# Patient Record
Sex: Female | Born: 1980 | Race: White | Hispanic: Yes | Marital: Married | Smoking: Never smoker
Health system: Southern US, Community
[De-identification: ages and names within clinical notes are randomized; demographics above are authoritative.]

## PROBLEM LIST (undated history)

## (undated) DIAGNOSIS — D649 Anemia, unspecified: Secondary | ICD-10-CM

## (undated) HISTORY — DX: Anemia, unspecified: D64.9

## (undated) HISTORY — PX: TUBAL LIGATION: SHX77

---

## 2002-12-30 ENCOUNTER — Encounter: Admission: RE | Admit: 2002-12-30 | Discharge: 2002-12-30 | Payer: Self-pay | Admitting: *Deleted

## 2002-12-31 ENCOUNTER — Ambulatory Visit (HOSPITAL_COMMUNITY): Admission: RE | Admit: 2002-12-31 | Discharge: 2002-12-31 | Payer: Self-pay | Admitting: *Deleted

## 2002-12-31 ENCOUNTER — Encounter: Admission: RE | Admit: 2002-12-31 | Discharge: 2002-12-31 | Payer: Self-pay | Admitting: *Deleted

## 2003-01-06 ENCOUNTER — Encounter: Admission: RE | Admit: 2003-01-06 | Discharge: 2003-01-06 | Payer: Self-pay | Admitting: *Deleted

## 2003-01-20 ENCOUNTER — Encounter: Admission: RE | Admit: 2003-01-20 | Discharge: 2003-01-20 | Payer: Self-pay | Admitting: *Deleted

## 2003-01-27 ENCOUNTER — Encounter: Admission: RE | Admit: 2003-01-27 | Discharge: 2003-01-27 | Payer: Self-pay | Admitting: *Deleted

## 2003-02-10 ENCOUNTER — Encounter: Admission: RE | Admit: 2003-02-10 | Discharge: 2003-02-10 | Payer: Self-pay | Admitting: *Deleted

## 2003-02-17 ENCOUNTER — Encounter: Admission: RE | Admit: 2003-02-17 | Discharge: 2003-02-17 | Payer: Self-pay | Admitting: *Deleted

## 2003-02-19 ENCOUNTER — Inpatient Hospital Stay (HOSPITAL_COMMUNITY): Admission: AD | Admit: 2003-02-19 | Discharge: 2003-02-20 | Payer: Self-pay | Admitting: *Deleted

## 2007-07-14 ENCOUNTER — Emergency Department (HOSPITAL_COMMUNITY): Admission: EM | Admit: 2007-07-14 | Discharge: 2007-07-14 | Payer: Self-pay | Admitting: Emergency Medicine

## 2007-07-16 ENCOUNTER — Inpatient Hospital Stay (HOSPITAL_COMMUNITY): Admission: AD | Admit: 2007-07-16 | Discharge: 2007-07-16 | Payer: Self-pay | Admitting: Gynecology

## 2007-07-17 ENCOUNTER — Ambulatory Visit: Payer: Self-pay | Admitting: Obstetrics and Gynecology

## 2007-07-17 ENCOUNTER — Ambulatory Visit (HOSPITAL_COMMUNITY): Admission: AD | Admit: 2007-07-17 | Discharge: 2007-07-17 | Payer: Self-pay | Admitting: Gynecology

## 2007-07-17 ENCOUNTER — Encounter: Payer: Self-pay | Admitting: Obstetrics and Gynecology

## 2007-07-31 ENCOUNTER — Ambulatory Visit: Payer: Self-pay | Admitting: Obstetrics and Gynecology

## 2007-08-24 ENCOUNTER — Emergency Department (HOSPITAL_COMMUNITY): Admission: EM | Admit: 2007-08-24 | Discharge: 2007-08-24 | Payer: Self-pay | Admitting: Emergency Medicine

## 2008-12-23 IMAGING — US US OB COMP LESS 14 WK
1 series · 14 of 26 positions shown · non-contrast
Comparison: None

CLINICAL DATA: 16 weeks pregnant by last menstrual period with bleeding.

OBSTETRICAL ULTRASOUND <14 WK TRANSABDOMINAL AND TRANSVAGINAL OB:
TECHNIQUE: Both transabdominal and transvaginal ultrasound examinations were
performed for complete evaluation of the gestation as well as the maternal
uterus, adnexal regions, and pelvic cul-de-sac.

[Series 1: us ob comp less 14 wks · 14 of 26 slices shown]
[im 1/26]
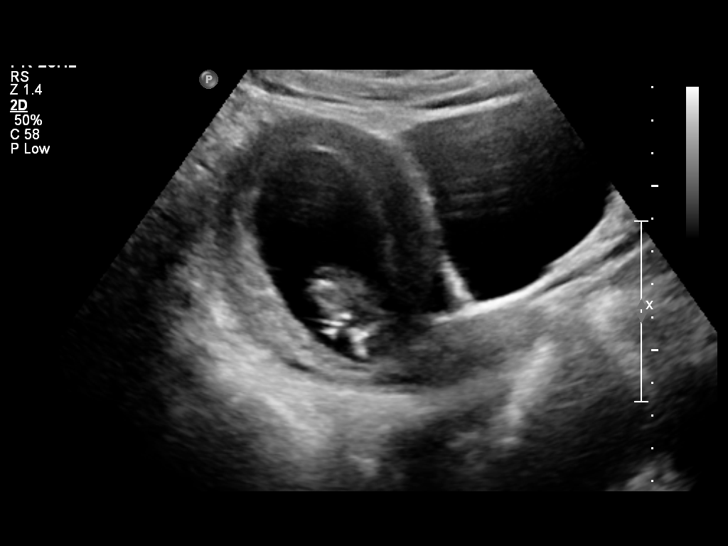
[im 3/26]
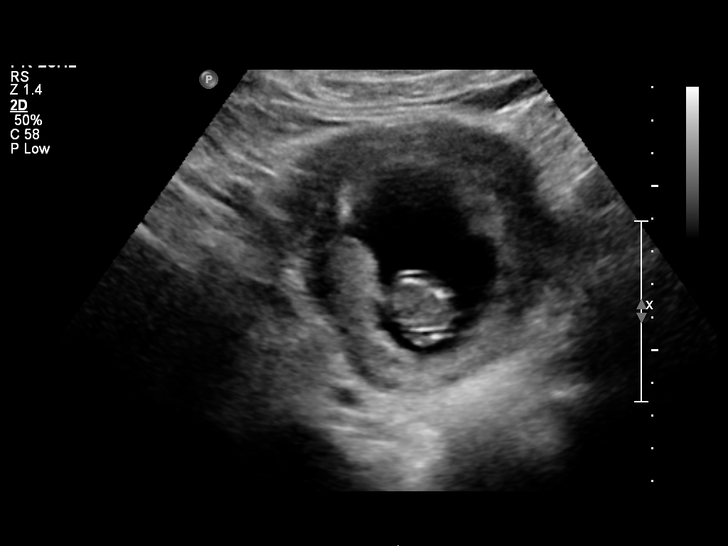
[im 5/26]
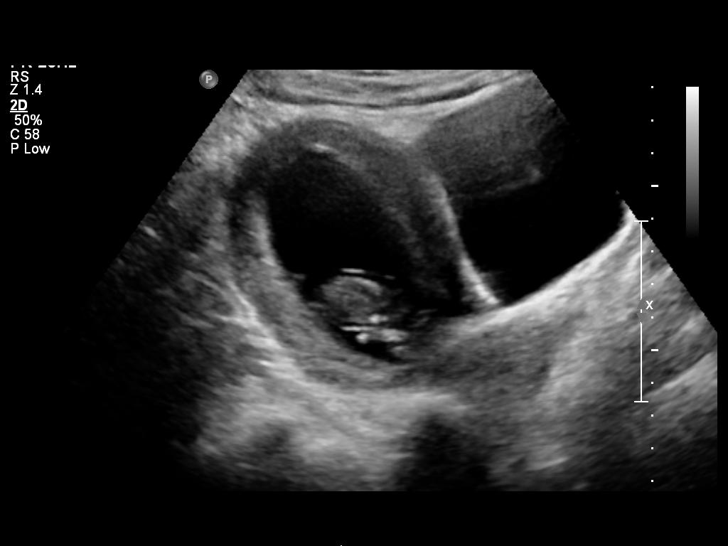
[im 7/26]
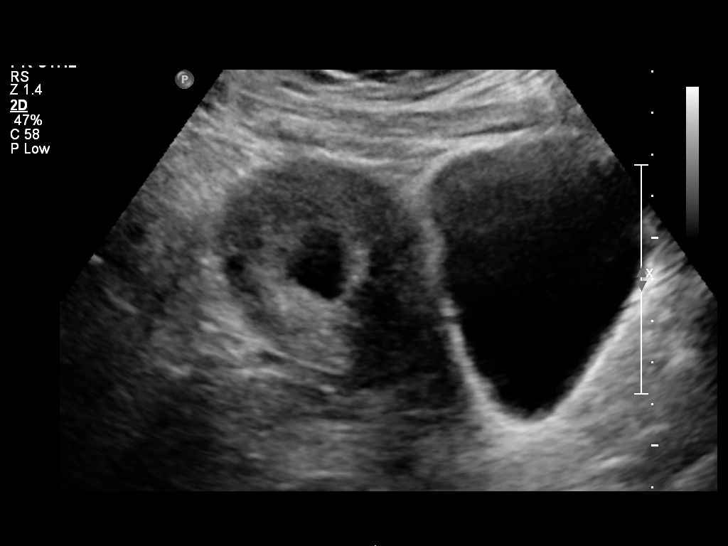
[im 9/26]
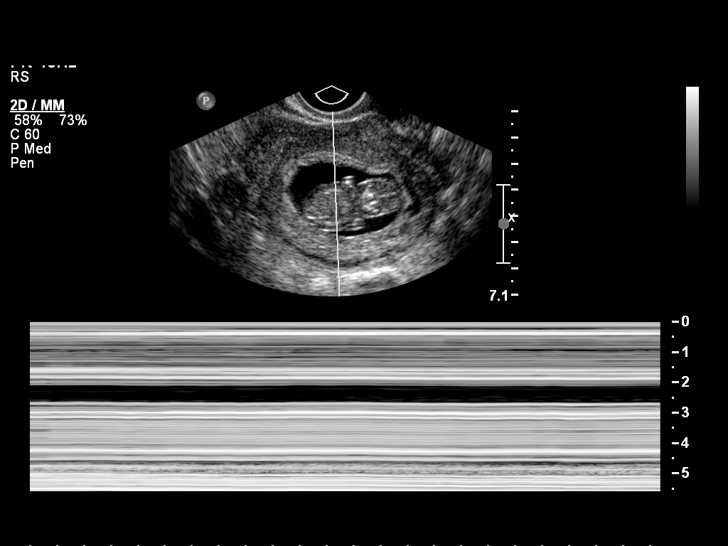
[im 11/26]
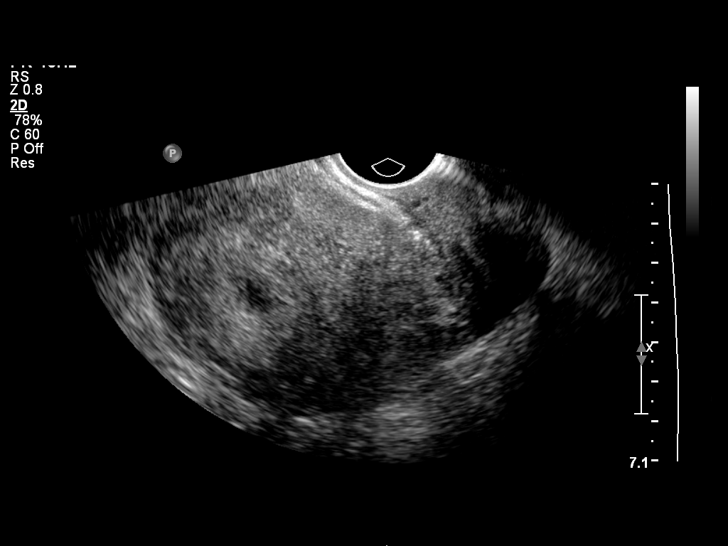
[im 13/26]
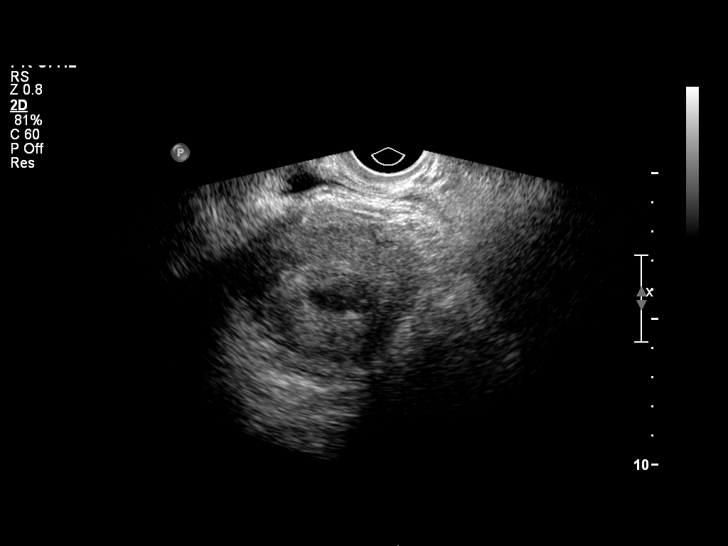
[im 14/26]
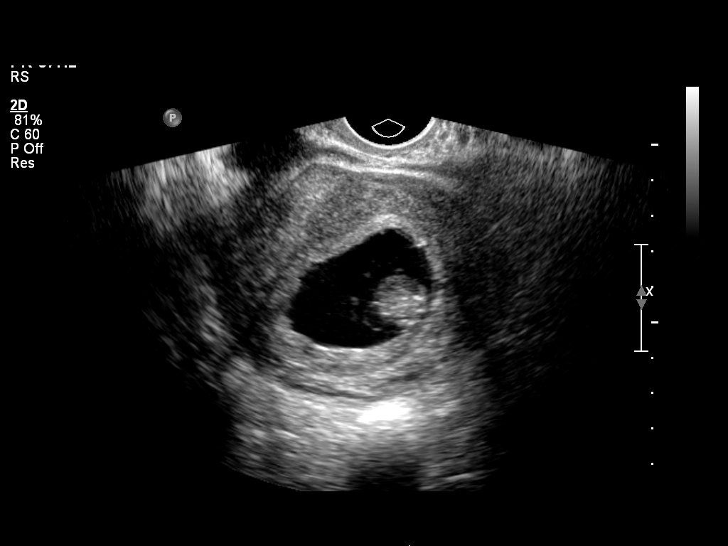
[im 16/26]
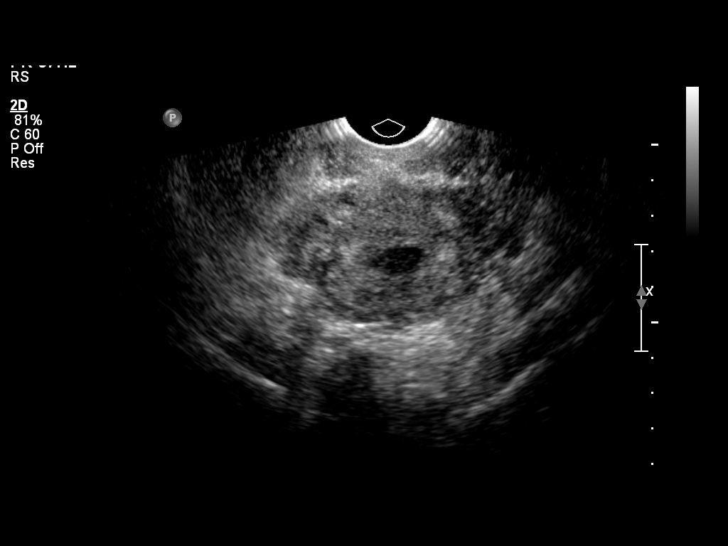
[im 18/26]
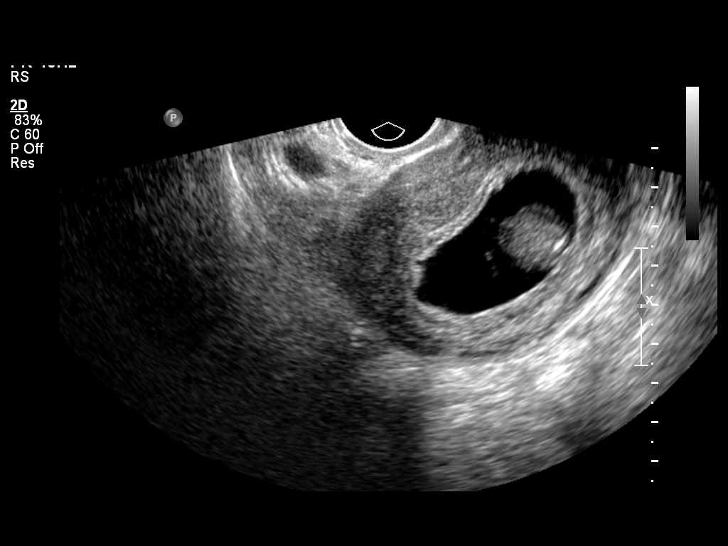
[im 20/26]
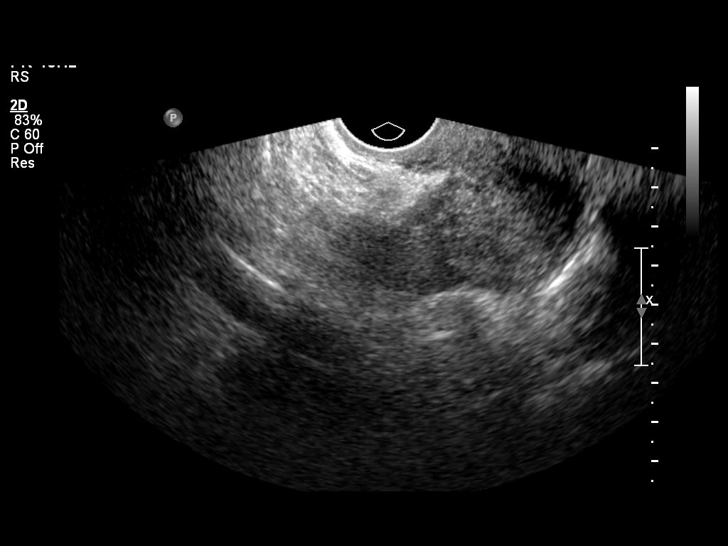
[im 22/26]
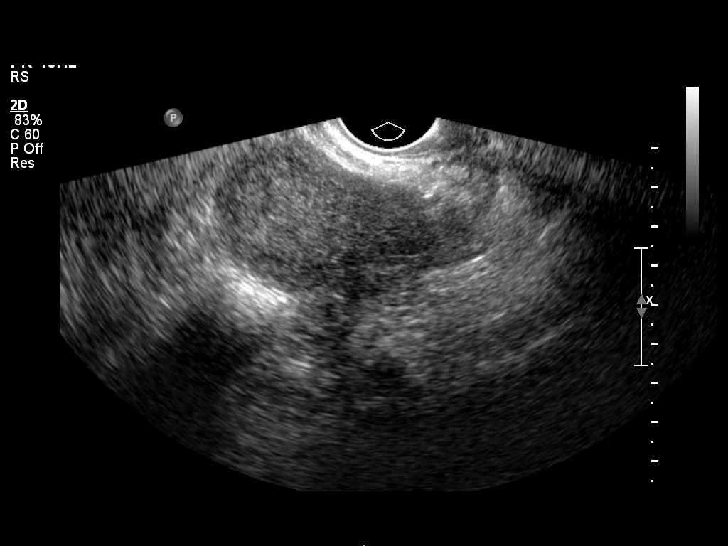
[im 24/26]
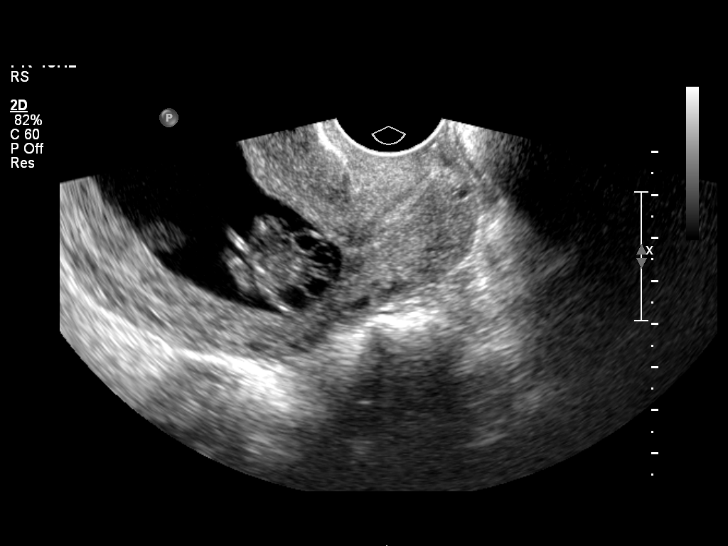
[im 26/26]
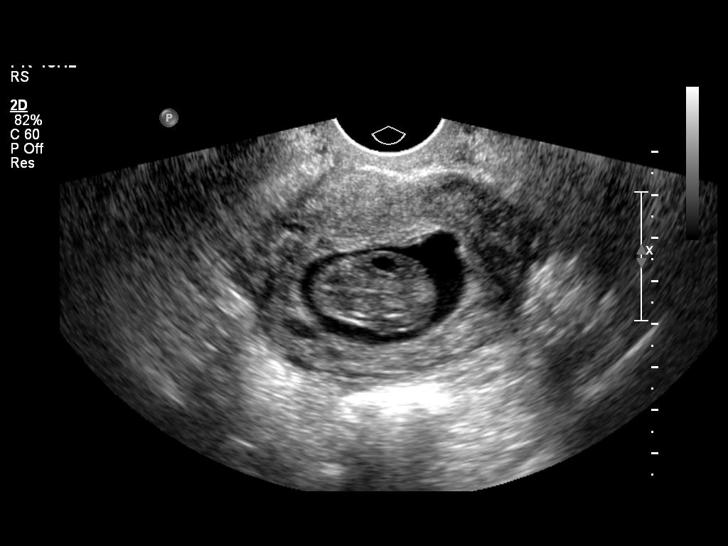

[14 of 26 positions shown; findings below may reference images not displayed]

FINDINGS: Single intrauterine gestational sac with a crown-rump length of
cm corresponds to 10 weeks 6 days. Despite extensive interrogation, no evidence
of fetal movement or cardiac activity. No subchorionic hemorrhage.

Lack of visualization of either ovary. No adnexal mass or significant free
fluid.
IMPRESSION: 1. Findings most consistent with fetal demise. 3.9 cm fetus without motion or
cardiac activity. Findings were discussed by the ultrasound technologist,
Tiger, with [facility] prior to this dictation on 07/15/2006.
2. Lack of visualization of the ovaries.

## 2010-10-18 NOTE — Op Note (Signed)
Christy Graham, Christy Graham        ACCOUNT NO.:  [id number]   MEDICAL RECORD NO.:  [id number]          PATIENT TYPE:  AMB   LOCATION:  [facility]                          FACILITY:  [facility]   PHYSICIAN:  Tilda Burrow, M.D. DATE OF BIRTH:  10/23/1980   DATE OF PROCEDURE:  07/17/2007  DATE OF DISCHARGE:  07/17/2007                               OPERATIVE REPORT   PREOPERATIVE DIAGNOSIS:  Missed abortion, 16 weeks, left gluteal skin  tag.   POSTOPERATIVE DIAGNOSIS:  Missed abortion, 16 weeks, left gluteal skin  tag.   PROCEDURE:  Incidental removal of left gluteal skin tag along with  suction dilation and curettage.   SURGEON:  Tilda Burrow, M.D.   ASSISTANT:  None.   ANESTHESIA:  Paracervical with IV sedation.   COMPLICATIONS:  None.   FINDINGS:  Blood type A+, uterus sounding to 11 cm pre procedure and  reducing in size nicely with procedure.   DETAILS OF PROCEDURE:  The patient was taken operating room, prepped and  draped for vaginal procedure with low lithotomy leg support.  The  perineum and vagina were prepped, paracervical block applied including  anesthesia anterior cervical lip where a single-tooth tenaculum was used  to grasp the cervix.  The uterus was sounded in the anteflexed position  to 11 cm dilated to 33 [place] allowing introduction of a curved 10 mm  suction curette which obtained fluid and tissue and old blood consistent  with missed abortion.  The uterus shrank down nicely during the  procedure and post suction D&C curettage with smooth sharp curette  confirmed a uniform smooth internal uterine surface.  The procedure was  considered successfully completed.   Skin tag removal.  After prepping and prior to the dilation and  curettage, I used a hemostat to crossclamp the pedicle of the skin tag  having already placed local anesthesia beneath the its base.  The  crossclamping site was then transected and was quite hemostatic.  At the  completion of the  remainder of the surgical procedure, band-aid was  placed over this, no sutures were required, bleeding was minimal.      Tilda Burrow, M.D.  Electronically Signed    JVF/MEDQ  D:  07/17/2007  T:  07/19/2007  Job:  [id number]

## 2011-02-24 LAB — CBC
HCT: 37.2
Hemoglobin: 12.4
Hemoglobin: 12.6
MCHC: 33.8
MCV: 82
RBC: 4.5
RBC: 4.53
WBC: 8.7
WBC: 8.3

## 2011-02-24 LAB — ABO/RH: ABO/RH(D): A POS

## 2011-02-24 LAB — DIFFERENTIAL
Eosinophils Absolute: 0.2
Eosinophils Relative: 2
Lymphs Abs: 2.1
Monocytes Absolute: 0.6
Monocytes Relative: 7
Neutrophils Relative %: 66

## 2011-02-24 LAB — HCG, QUANTITATIVE, PREGNANCY: hCG, Beta Chain, Quant, S: 262 — ABNORMAL HIGH

## 2011-05-12 ENCOUNTER — Encounter (HOSPITAL_COMMUNITY): Payer: Self-pay | Admitting: Anesthesiology

## 2011-05-12 ENCOUNTER — Encounter (HOSPITAL_COMMUNITY): Payer: Self-pay | Admitting: *Deleted

## 2011-05-12 ENCOUNTER — Inpatient Hospital Stay (HOSPITAL_COMMUNITY)
Admission: AD | Admit: 2011-05-12 | Discharge: 2011-05-14 | DRG: 766 | Disposition: A | Discharge status: Home / Self Care | Payer: Medicaid Other | Source: Ambulatory Visit | Attending: Obstetrics | Admitting: Obstetrics

## 2011-05-12 ENCOUNTER — Inpatient Hospital Stay (HOSPITAL_COMMUNITY): Payer: Medicaid Other

## 2011-05-12 ENCOUNTER — Other Ambulatory Visit: Payer: Self-pay | Admitting: Obstetrics

## 2011-05-12 ENCOUNTER — Inpatient Hospital Stay (HOSPITAL_COMMUNITY): Payer: Medicaid Other | Admitting: Anesthesiology

## 2011-05-12 ENCOUNTER — Encounter (HOSPITAL_COMMUNITY): Payer: Self-pay | Admitting: Pediatric Intensive Care

## 2011-05-12 ENCOUNTER — Encounter (HOSPITAL_COMMUNITY)
Admission: AD | Disposition: A | Discharge status: Home / Self Care | Payer: Self-pay | Source: Ambulatory Visit | Attending: Obstetrics

## 2011-05-12 DIAGNOSIS — IMO0002 Reserved for concepts with insufficient information to code with codable children: Secondary | ICD-10-CM

## 2011-05-12 DIAGNOSIS — O321XX0 Maternal care for breech presentation, not applicable or unspecified: Principal | ICD-10-CM | POA: Diagnosis present

## 2011-05-12 DIAGNOSIS — O4700 False labor before 37 completed weeks of gestation, unspecified trimester: Secondary | ICD-10-CM

## 2011-05-12 DIAGNOSIS — Z302 Encounter for sterilization: Secondary | ICD-10-CM

## 2011-05-12 LAB — RPR: RPR Ser Ql: NONREACTIVE

## 2011-05-12 LAB — CBC
Hemoglobin: 12 g/dL (ref 12.0–15.0)
MCH: 26.8 pg (ref 26.0–34.0)
MCV: 81.9 fL (ref 78.0–100.0)
RBC: 4.48 MIL/uL (ref 3.87–5.11)

## 2011-05-12 SURGERY — Surgical Case
Anesthesia: Spinal | Site: Abdomen | Wound class: Clean Contaminated

## 2011-05-12 MED ORDER — KETOROLAC TROMETHAMINE 60 MG/2ML IM SOLN
60.0000 mg | Freq: Once | INTRAMUSCULAR | Status: AC | PRN
  Administered 2011-05-12: 60 mg via INTRAMUSCULAR

## 2011-05-12 MED ORDER — OXYTOCIN BOLUS FROM INFUSION
500.0000 mL | Freq: Once | INTRAVENOUS | Status: DC
  Filled 2011-05-12: qty 500

## 2011-05-12 MED ORDER — WITCH HAZEL-GLYCERIN EX PADS: 1.0000 "application " | MEDICATED_PAD | CUTANEOUS | Status: DC | PRN

## 2011-05-12 MED ORDER — SENNOSIDES-DOCUSATE SODIUM 8.6-50 MG PO TABS
2.0000 | ORAL_TABLET | Freq: Every day | ORAL | Status: DC
  Administered 2011-05-12 – 2011-05-13 (×2): 2 via ORAL

## 2011-05-12 MED ORDER — DIPHENHYDRAMINE HCL 25 MG PO CAPS: 25.0000 mg | ORAL_CAPSULE | Freq: Four times a day (QID) | ORAL | Status: DC | PRN

## 2011-05-12 MED ORDER — OXYTOCIN 20 UNITS IN LACTATED RINGERS INFUSION - SIMPLE
INTRAVENOUS | Status: DC | PRN
  Administered 2011-05-12 (×2): 20 [IU] via INTRAVENOUS

## 2011-05-12 MED ORDER — OXYCODONE-ACETAMINOPHEN 5-325 MG PO TABS
1.0000 | ORAL_TABLET | ORAL | Status: DC | PRN
  Administered 2011-05-12: 1 via ORAL
  Administered 2011-05-13 (×2): 2 via ORAL
  Administered 2011-05-13 – 2011-05-14 (×3): 1 via ORAL
  Administered 2011-05-14: 2 via ORAL
  Filled 2011-05-12: qty 1
  Filled 2011-05-12: qty 2
  Filled 2011-05-12: qty 1
  Filled 2011-05-12: qty 2
  Filled 2011-05-12 (×2): qty 1
  Filled 2011-05-12: qty 2

## 2011-05-12 MED ORDER — OXYTOCIN 20 UNITS IN LACTATED RINGERS INFUSION - SIMPLE: 125.0000 mL/h | INTRAVENOUS | Status: AC

## 2011-05-12 MED ORDER — LACTATED RINGERS IV SOLN
INTRAVENOUS | Status: DC | PRN
  Administered 2011-05-12: 07:00:00 via INTRAVENOUS

## 2011-05-12 MED ORDER — METOCLOPRAMIDE HCL 5 MG/ML IJ SOLN: 10.0000 mg | Freq: Three times a day (TID) | INTRAMUSCULAR | Status: DC | PRN

## 2011-05-12 MED ORDER — OXYTOCIN 20 UNITS IN LACTATED RINGERS INFUSION - SIMPLE: 125.0000 mL/h | Freq: Once | INTRAVENOUS | Status: DC

## 2011-05-12 MED ORDER — MEPERIDINE HCL 25 MG/ML IJ SOLN: 6.2500 mg | INTRAMUSCULAR | Status: DC | PRN

## 2011-05-12 MED ORDER — SODIUM CHLORIDE 0.9 % IV SOLN
1.0000 ug/kg/h | INTRAVENOUS | Status: DC | PRN
  Filled 2011-05-12: qty 2.5

## 2011-05-12 MED ORDER — FENTANYL CITRATE 0.05 MG/ML IJ SOLN: 25.0000 ug | INTRAMUSCULAR | Status: DC | PRN

## 2011-05-12 MED ORDER — OXYTOCIN 20 UNITS IN LACTATED RINGERS INFUSION - SIMPLE: 1.0000 m[IU]/min | INTRAVENOUS | Status: DC

## 2011-05-12 MED ORDER — TETANUS-DIPHTH-ACELL PERTUSSIS 5-2.5-18.5 LF-MCG/0.5 IM SUSP: 0.5000 mL | Freq: Once | INTRAMUSCULAR | Status: DC

## 2011-05-12 MED ORDER — CITRIC ACID-SODIUM CITRATE 334-500 MG/5ML PO SOLN
30.0000 mL | ORAL | Status: DC | PRN
  Administered 2011-05-12: 30 mL via ORAL
  Filled 2011-05-12: qty 15

## 2011-05-12 MED ORDER — PRENATAL PLUS 27-1 MG PO TABS
1.0000 | ORAL_TABLET | Freq: Every day | ORAL | Status: DC
  Administered 2011-05-13 – 2011-05-14 (×2): 1 via ORAL
  Filled 2011-05-12 (×2): qty 1

## 2011-05-12 MED ORDER — BUPIVACAINE IN DEXTROSE 0.75-8.25 % IT SOLN
INTRATHECAL | Status: DC | PRN
  Administered 2011-05-12: 11.75 mg via INTRATHECAL

## 2011-05-12 MED ORDER — ONDANSETRON HCL 4 MG/2ML IJ SOLN: 4.0000 mg | INTRAMUSCULAR | Status: DC | PRN

## 2011-05-12 MED ORDER — DIPHENHYDRAMINE HCL 50 MG/ML IJ SOLN: 12.5000 mg | INTRAMUSCULAR | Status: DC | PRN

## 2011-05-12 MED ORDER — DIBUCAINE 1 % RE OINT: 1.0000 "application " | TOPICAL_OINTMENT | RECTAL | Status: DC | PRN

## 2011-05-12 MED ORDER — SIMETHICONE 80 MG PO CHEW
80.0000 mg | CHEWABLE_TABLET | Freq: Three times a day (TID) | ORAL | Status: DC
  Administered 2011-05-12 – 2011-05-13 (×5): 80 mg via ORAL

## 2011-05-12 MED ORDER — BUTORPHANOL TARTRATE 2 MG/ML IJ SOLN: 1.0000 mg | INTRAMUSCULAR | Status: DC | PRN

## 2011-05-12 MED ORDER — ONDANSETRON HCL 4 MG/2ML IJ SOLN
INTRAMUSCULAR | Status: DC | PRN
  Administered 2011-05-12: 4 mg via INTRAVENOUS

## 2011-05-12 MED ORDER — LACTATED RINGERS IV SOLN
INTRAVENOUS | Status: DC
  Administered 2011-05-12: 13:00:00 via INTRAVENOUS

## 2011-05-12 MED ORDER — SIMETHICONE 80 MG PO CHEW: 80.0000 mg | CHEWABLE_TABLET | ORAL | Status: DC | PRN

## 2011-05-12 MED ORDER — ONDANSETRON HCL 4 MG/2ML IJ SOLN: 4.0000 mg | Freq: Three times a day (TID) | INTRAMUSCULAR | Status: DC | PRN

## 2011-05-12 MED ORDER — NALBUPHINE SYRINGE 5 MG/0.5 ML
5.0000 mg | INJECTION | INTRAMUSCULAR | Status: DC | PRN
  Filled 2011-05-12: qty 1

## 2011-05-12 MED ORDER — SODIUM CHLORIDE 0.9 % IJ SOLN: 3.0000 mL | INTRAMUSCULAR | Status: DC | PRN

## 2011-05-12 MED ORDER — MORPHINE SULFATE (PF) 0.5 MG/ML IJ SOLN
INTRAMUSCULAR | Status: DC | PRN
  Administered 2011-05-12: .1 mg via INTRATHECAL

## 2011-05-12 MED ORDER — LIDOCAINE HCL (PF) 1 % IJ SOLN: 30.0000 mL | INTRAMUSCULAR | Status: DC | PRN

## 2011-05-12 MED ORDER — KETOROLAC TROMETHAMINE 30 MG/ML IJ SOLN: 30.0000 mg | Freq: Four times a day (QID) | INTRAMUSCULAR | Status: AC | PRN

## 2011-05-12 MED ORDER — HYDROXYZINE HCL 50 MG/ML IM SOLN: 50.0000 mg | Freq: Four times a day (QID) | INTRAMUSCULAR | Status: DC | PRN

## 2011-05-12 MED ORDER — DIPHENHYDRAMINE HCL 50 MG/ML IJ SOLN: 25.0000 mg | INTRAMUSCULAR | Status: DC | PRN

## 2011-05-12 MED ORDER — SCOPOLAMINE 1 MG/3DAYS TD PT72
MEDICATED_PATCH | TRANSDERMAL | Status: AC
  Filled 2011-05-12: qty 1

## 2011-05-12 MED ORDER — ONDANSETRON HCL 4 MG/2ML IJ SOLN
INTRAMUSCULAR | Status: AC
  Filled 2011-05-12: qty 2

## 2011-05-12 MED ORDER — TERBUTALINE SULFATE 1 MG/ML IJ SOLN: 0.2500 mg | Freq: Once | INTRAMUSCULAR | Status: DC | PRN

## 2011-05-12 MED ORDER — LANOLIN HYDROUS EX OINT: 1.0000 "application " | TOPICAL_OINTMENT | CUTANEOUS | Status: DC | PRN

## 2011-05-12 MED ORDER — LACTATED RINGERS IV SOLN: 500.0000 mL | INTRAVENOUS | Status: DC | PRN

## 2011-05-12 MED ORDER — SCOPOLAMINE 1 MG/3DAYS TD PT72
1.0000 | MEDICATED_PATCH | Freq: Once | TRANSDERMAL | Status: DC
  Administered 2011-05-12: 1.5 mg via TRANSDERMAL

## 2011-05-12 MED ORDER — IBUPROFEN 600 MG PO TABS: 600.0000 mg | ORAL_TABLET | Freq: Four times a day (QID) | ORAL | Status: DC | PRN

## 2011-05-12 MED ORDER — KETOROLAC TROMETHAMINE 60 MG/2ML IM SOLN
INTRAMUSCULAR | Status: AC
  Administered 2011-05-12: 60 mg via INTRAMUSCULAR
  Filled 2011-05-12: qty 2

## 2011-05-12 MED ORDER — NALOXONE HCL 0.4 MG/ML IJ SOLN: 0.4000 mg | INTRAMUSCULAR | Status: DC | PRN

## 2011-05-12 MED ORDER — KETOROLAC TROMETHAMINE 30 MG/ML IJ SOLN
30.0000 mg | Freq: Four times a day (QID) | INTRAMUSCULAR | Status: AC | PRN
  Administered 2011-05-12: 30 mg via INTRAMUSCULAR
  Filled 2011-05-12: qty 1

## 2011-05-12 MED ORDER — ZOLPIDEM TARTRATE 5 MG PO TABS: 5.0000 mg | ORAL_TABLET | Freq: Every evening | ORAL | Status: DC | PRN

## 2011-05-12 MED ORDER — CEFAZOLIN SODIUM 1-5 GM-% IV SOLN
INTRAVENOUS | Status: DC | PRN
  Administered 2011-05-12: 1 g via INTRAVENOUS

## 2011-05-12 MED ORDER — DIPHENHYDRAMINE HCL 25 MG PO CAPS: 25.0000 mg | ORAL_CAPSULE | ORAL | Status: DC | PRN

## 2011-05-12 MED ORDER — FENTANYL CITRATE 0.05 MG/ML IJ SOLN
INTRAMUSCULAR | Status: AC
  Filled 2011-05-12: qty 2

## 2011-05-12 MED ORDER — MENTHOL 3 MG MT LOZG: 1.0000 | LOZENGE | OROMUCOSAL | Status: DC | PRN

## 2011-05-12 MED ORDER — FENTANYL CITRATE 0.05 MG/ML IJ SOLN
INTRAMUSCULAR | Status: DC | PRN
  Administered 2011-05-12: 15 ug via INTRATHECAL

## 2011-05-12 MED ORDER — HYDROXYZINE HCL 50 MG PO TABS: 50.0000 mg | ORAL_TABLET | Freq: Four times a day (QID) | ORAL | Status: DC | PRN

## 2011-05-12 MED ORDER — MORPHINE SULFATE 0.5 MG/ML IJ SOLN
INTRAMUSCULAR | Status: AC
  Filled 2011-05-12: qty 10

## 2011-05-12 MED ORDER — ACETAMINOPHEN 325 MG PO TABS: 650.0000 mg | ORAL_TABLET | ORAL | Status: DC | PRN

## 2011-05-12 MED ORDER — OXYTOCIN 10 UNIT/ML IJ SOLN
INTRAMUSCULAR | Status: AC
  Filled 2011-05-12: qty 4

## 2011-05-12 MED ORDER — IBUPROFEN 600 MG PO TABS
600.0000 mg | ORAL_TABLET | Freq: Four times a day (QID) | ORAL | Status: DC
  Administered 2011-05-12 – 2011-05-14 (×8): 600 mg via ORAL

## 2011-05-12 MED ORDER — ONDANSETRON HCL 4 MG/2ML IJ SOLN: 4.0000 mg | Freq: Four times a day (QID) | INTRAMUSCULAR | Status: DC | PRN

## 2011-05-12 MED ORDER — OXYCODONE-ACETAMINOPHEN 5-325 MG PO TABS: 2.0000 | ORAL_TABLET | ORAL | Status: DC | PRN

## 2011-05-12 MED ORDER — IBUPROFEN 600 MG PO TABS
600.0000 mg | ORAL_TABLET | Freq: Four times a day (QID) | ORAL | Status: DC | PRN
  Filled 2011-05-12 (×8): qty 1

## 2011-05-12 MED ORDER — ZOLPIDEM TARTRATE 10 MG PO TABS: 10.0000 mg | ORAL_TABLET | Freq: Every evening | ORAL | Status: DC | PRN

## 2011-05-12 MED ORDER — ONDANSETRON HCL 4 MG PO TABS: 4.0000 mg | ORAL_TABLET | ORAL | Status: DC | PRN

## 2011-05-12 MED ORDER — LACTATED RINGERS IV SOLN
INTRAVENOUS | Status: DC
  Administered 2011-05-12: 05:00:00 via INTRAVENOUS

## 2011-05-12 SURGICAL SUPPLY — 28 items
ADH SKN CLS APL DERMABOND .7 (GAUZE/BANDAGES/DRESSINGS) ×1
CHLORAPREP W/TINT 26ML (MISCELLANEOUS) ×2 IMPLANT
CLOTH BEACON ORANGE TIMEOUT ST (SAFETY) ×2 IMPLANT
DERMABOND ADVANCED (GAUZE/BANDAGES/DRESSINGS) ×1
DERMABOND ADVANCED .7 DNX12 (GAUZE/BANDAGES/DRESSINGS) ×1 IMPLANT
ELECT REM PT RETURN 9FT ADLT (ELECTROSURGICAL) ×2
ELECTRODE REM PT RTRN 9FT ADLT (ELECTROSURGICAL) ×1 IMPLANT
EXTRACTOR VACUUM M CUP 4 TUBE (SUCTIONS) IMPLANT
GLOVE BIO SURGEON STRL SZ8.5 (GLOVE) ×4 IMPLANT
GOWN PREVENTION PLUS LG XLONG (DISPOSABLE) ×4 IMPLANT
GOWN PREVENTION PLUS XXLARGE (GOWN DISPOSABLE) ×2 IMPLANT
KIT ABG SYR 3ML LUER SLIP (SYRINGE) IMPLANT
NDL HYPO 25X5/8 SAFETYGLIDE (NEEDLE) ×1 IMPLANT
NEEDLE HYPO 25X5/8 SAFETYGLIDE (NEEDLE) ×2 IMPLANT
NS IRRIG 1000ML POUR BTL (IV SOLUTION) ×2 IMPLANT
PACK C SECTION WH (CUSTOM PROCEDURE TRAY) ×2 IMPLANT
SLEEVE SCD COMPRESS KNEE MED (MISCELLANEOUS) IMPLANT
SUT CHROMIC 0 CT 802H (SUTURE) ×2 IMPLANT
SUT CHROMIC 1 CTX 36 (SUTURE) ×4 IMPLANT
SUT GUT PLAIN 0 CT-3 TAN 27 (SUTURE) IMPLANT
SUT MON AB 4-0 PS1 27 (SUTURE) ×2 IMPLANT
SUT VIC AB 0 CT1 18XCR BRD8 (SUTURE) IMPLANT
SUT VIC AB 0 CT1 8-18 (SUTURE)
SUT VIC AB 0 CTX 36 (SUTURE) ×4
SUT VIC AB 0 CTX36XBRD ANBCTRL (SUTURE) ×2 IMPLANT
TOWEL OR 17X24 6PK STRL BLUE (TOWEL DISPOSABLE) ×4 IMPLANT
TRAY FOLEY CATH 14FR (SET/KITS/TRAYS/PACK) ×2 IMPLANT
WATER STERILE IRR 1000ML POUR (IV SOLUTION) ×2 IMPLANT

## 2011-05-12 NOTE — Op Note (Signed)
[facility] 39+ weeks active labor and breech presentation Postop diagnosis primary low transverse cesarean section and tubal ligation Anesthesia spinal Surgeon Dr. Francoise Ceo Procedure after the spinal administered patient in the supine position abdomen prepped and draped data entered with a Foley catheter a transverse suprapubic incision made carried down to the rectus fascia fascia cleaned and incised the length of the incision recti muscles retracted laterally peritoneum incised longitudinally transverse incision made over the visceroperitoneum above the bladder the bladder mobilized inferiorly transverse low uterine incision made patient delivered of a frank breech female Apgar 89 weighing 5 lbs. 9 oz. the placenta was posterior Moban and and sent to labor and delivery uterine cavity clean with dry laps the uterus closed in one layer with continuous within normal on chromic bladder flap reattached to a chromic hemostasis satisfactory the right tube  Grasped with a babcock clamp and a suture of 0 plain used to tie the tube and 1 inch tube removed procedure done in a similar fashion on the other side  lap and sponge counts correct hemostasis satisfactory abdomen chosen as peritoneum continuous with 2-0 chromic fascia contiguous with 0 Dexon and the skin shows a subcuticular stitch of 401 blood loss 600 cc patient tolerated the procedure well taken to recovery in good condition end of dictation dictated by Dr. Gaynell Face

## 2011-05-12 NOTE — Progress Notes (Signed)
Pt delivered viable female via primary cesarean section for breech presentation by Dr Gaynell Face.

## 2011-05-12 NOTE — Anesthesia Preprocedure Evaluation (Signed)
Anesthesia Evaluation  Patient identified by MRN, date of birth, ID band Patient awake    Reviewed: Allergy & Precautions, H&P , NPO status , Patient's Chart, lab work & pertinent test results, reviewed documented beta blocker date and time   History of Anesthesia Complications Negative for: history of anesthetic complications  Airway Mallampati: II TM Distance: >3 FB Neck ROM: full    Dental  (+) Teeth Intact   Pulmonary neg pulmonary ROS,  clear to auscultation        Cardiovascular neg cardio ROS regular Normal    Neuro/Psych Negative Neurological ROS  Negative Psych ROS   GI/Hepatic negative GI ROS, Neg liver ROS,   Endo/Other  Negative Endocrine ROS  Renal/GU negative Renal ROS  Genitourinary negative   Musculoskeletal   Abdominal   Peds  Hematology negative hematology ROS (+)   Anesthesia Other Findings   Reproductive/Obstetrics (+) Pregnancy (breech in labor, h/o SVD x3 w/o epidural)                           Anesthesia Physical Anesthesia Plan  ASA: II and Emergent  Anesthesia Plan: Spinal   Post-op Pain Management:    Induction:   Airway Management Planned:   Additional Equipment:   Intra-op Plan:   Post-operative Plan:   Informed Consent: I have reviewed the patients History and Physical, chart, labs and discussed the procedure including the risks, benefits and alternatives for the proposed anesthesia with the patient or authorized representative who has indicated his/her understanding and acceptance.     Plan Discussed with: CRNA and Surgeon  Anesthesia Plan Comments:         Anesthesia Quick Evaluation

## 2011-05-12 NOTE — H&P (Signed)
Christy Graham is a 30 y.o. female presenting for SROM. Maternal Medical History:  Reason for admission: Reason for admission: rupture of membranes.  Reason for Admission:   nauseaMeconium-stained fluid.  Contractions: Onset was 3-5 hours ago.   Frequency: irregular.   Perceived severity is mild.    Fetal activity: Perceived fetal activity is normal.    Prenatal complications: no prenatal complications   OB History    Grav Para Term Preterm Abortions TAB SAB Ect Mult Living   5 3   1  1   3      History reviewed. No pertinent past medical history. History reviewed. No pertinent past surgical history. Family History: family history is not on file. Social History:  reports that she has never smoked. She does not have any smokeless tobacco history on file. She reports that she does not drink alcohol or use illicit drugs.  Review of Systems  Constitutional: Negative for fever.  Eyes: Negative for blurred vision.  Respiratory: Negative for shortness of breath.   Gastrointestinal: Negative for nausea and vomiting.  Skin: Negative for rash.  Neurological: Negative for headaches.    Dilation: 2.5 Effacement (%): 60 Station: -2 Exam by:: L. Munford RN Blood pressure 133/73, pulse 101, temperature 99.2 F (37.3 C), temperature source Oral, resp. rate 18, height 5\' 1"  (1.549 m), weight 79.379 kg (175 lb), SpO2 99.00%. Maternal Exam:  Uterine Assessment: Contraction strength is mild.  Contraction frequency is irregular.   Abdomen: Patient reports no abdominal tenderness. Fetal presentation: vertex  Introitus: not evaluated.     Fetal Exam Fetal Monitor Review: Variability: moderate (6-25 bpm).   Pattern: accelerations present and no decelerations.    Fetal State Assessment: Category I - tracings are normal.     Physical Exam  Constitutional: She appears well-developed.  HENT:  Head: Normocephalic.  Neck: Neck supple. No thyromegaly present.  Cardiovascular:  Normal rate and regular rhythm.   Respiratory: Breath sounds normal.  GI: Soft. Bowel sounds are normal.  Skin: No rash noted.    Prenatal labs: ABO, Rh:   Antibody:   Rubella:   RPR:    HBsAg:    HIV:    GBS:     Assessment/Plan: Multipara @ [redacted]w[redacted]d.  Prodromal labor, meconium-stained fluid.  Admit Augmentation of labor Monitor progress, anticipate an SVD  JACKSON-MOORE,Sylva Overley A 05/12/2011, 3:57 AM

## 2011-05-12 NOTE — Consult Note (Signed)
Neonatology Note:   Attendance at C-section:    I was asked to attend this primary C/S at term due to breech presentation. The mother is a G6P3A2  GBS unknown with onset of labor. ROM 8 hours prior to delivery, fluid with thin meconium. Mother did not receive antibiotics during labor and she remained afebrile. Infant delivered breech and was vigorous with good spontaneous cry and tone. Needed only minimal bulb suctioning. Ap 9/9. Lungs clear to ausc in DR. To CN to care of Pediatrician.   Deatra James, MD

## 2011-05-12 NOTE — Transfer of Care (Signed)
Immediate Anesthesia Transfer of Care Note  Patient: Christy Graham  Procedure(s) Performed:  CESAREAN SECTION  Patient Location: PACU  Anesthesia Type: Spinal  Level of Consciousness: awake, alert  and oriented  Airway & Oxygen Therapy: Patient Spontanous Breathing  Post-op Assessment: Report given to PACU RN and Post -op Vital signs reviewed and stable  Post vital signs: Reviewed and stable  Complications: No apparent anesthesia complications

## 2011-05-12 NOTE — Progress Notes (Signed)
Pt to OR via stretcher for cesarean section.

## 2011-05-12 NOTE — Progress Notes (Signed)
Made aware of breech presentation. Will call [facility].

## 2011-05-12 NOTE — Anesthesia Postprocedure Evaluation (Signed)
Anesthesia Post Note  Patient: Christy Graham  Procedure(s) Performed:  CESAREAN SECTION  Anesthesia type: Spinal  Patient location: PACU  Post pain: Pain level controlled  Post assessment: Post-op Vital signs reviewed  Last Vitals:  Filed Vitals:   05/12/11 0845  BP:   Pulse:   Temp: 36.7 C  Resp: 20    Post vital signs: Reviewed  Level of consciousness: awake  Complications: No apparent anesthesia complications

## 2011-05-12 NOTE — Progress Notes (Signed)
UR Chart review completed.  

## 2011-05-12 NOTE — Progress Notes (Signed)
Patient states she has been having abdominal pain since 11pm last night and she thinks her water broke

## 2011-05-12 NOTE — Anesthesia Procedure Notes (Signed)
Spinal  Patient location during procedure: OB Start time: 05/12/2011 6:52 AM Staffing Performed by: anesthesiologist  Preanesthetic Checklist Completed: patient identified, site marked, surgical consent, pre-op evaluation, timeout performed, IV checked, risks and benefits discussed and monitors and equipment checked Spinal Block Patient position: sitting Prep: site prepped and draped and DuraPrep Patient monitoring: heart rate, cardiac monitor, continuous pulse ox and blood pressure Approach: midline Location: L3-4 Injection technique: single-shot Needle Needle type: Sprotte  Needle gauge: 24 G Needle length: 9 cm Assessment Sensory level: T4 Additional Notes Clear free flow CSF on first attempt.  Patient tolerated procedure well.  Jasmine December, MD

## 2011-05-13 LAB — CBC
HCT: 27.5 % — ABNORMAL LOW (ref 36.0–46.0)
Hemoglobin: 8.9 g/dL — ABNORMAL LOW (ref 12.0–15.0)
MCH: 26.9 pg (ref 26.0–34.0)
MCHC: 32.4 g/dL (ref 30.0–36.0)
MCV: 83.1 fL (ref 78.0–100.0)
RBC: 3.31 MIL/uL — ABNORMAL LOW (ref 3.87–5.11)

## 2011-05-13 NOTE — Addendum Note (Signed)
Addendum  created 05/13/11 1022 by Quin Hoop Veatrice Eckstein   Modules edited:Notes Section

## 2011-05-13 NOTE — Progress Notes (Signed)
Patient ID: Christy Graham, female   DOB: [birth year], 30 y.o.   MRN: [id number] Postop day 1 Bile signs normal Fundus firm Incision clean Legs negative No complaints

## 2011-05-13 NOTE — Anesthesia Postprocedure Evaluation (Signed)
  Anesthesia Post-op Note  Patient: Christy Graham  Procedure(s) Performed:  CESAREAN SECTION  Patient Location: Mother/Baby  Anesthesia Type: Spinal  Level of Consciousness: awake, alert  and oriented  Airway and Oxygen Therapy: Patient Spontanous Breathing  Post-op Pain: mild  Post-op Assessment: Patient's Cardiovascular Status Stable, Respiratory Function Stable, Patent Airway, No signs of Nausea or vomiting and Pain level controlled  Post-op Vital Signs: stable  Complications: No apparent anesthesia complications

## 2011-05-13 NOTE — Consult Note (Signed)
Lactation assisted with positioning and latch.  Baby 31 hrs old, and Mom reports baby is feeding every 3 hrs.  Concern over baby being small, I asked to observe and assist with latching.  Mom using cradle hold and baby having difficult time getting a deep enough latch.  Assisted Mom to awaken baby well, by undressing baby so she can be skin to skin for stimulation.  Demonstrated football and cross cradle hold as options.  Baby opens her mouth widely and takes a few sucks and swallows and becomes sleepy.  Constant stimulation needed to keep baby nutritive.  Baby had just fed for 20 minutes.  With translation help from mom's 21 year old daughter, taught mother signs of a good latch to look for.  Encouraged every 2-3 hrs feedings skin to skin.   To evaluate need to obtain a pump from [facility] before discharge, but right now, baby output sufficient and weight loss at 3%.

## 2011-05-14 NOTE — Discharge Summary (Signed)
Obstetric Discharge Summary Reason for Admission: onset of labor Prenatal Procedures: none Intrapartum Procedures: cesarean: low cervical, transverse Postpartum Procedures: none Complications-Operative and Postpartum: none Hemoglobin  Date Value Range Status  05/13/2011 8.9* 12.0-15.0 (g/dL) Final     REPEATED TO VERIFY     DELTA CHECK NOTED     HCT  Date Value Range Status  05/13/2011 27.5* 36.0-46.0 (%) Final    Discharge Diagnoses: Term Pregnancy-delivered  Discharge Information: Date: 05/14/2011 Activity: pelvic rest Diet: routine Medications: Percocet Condition: stable Instructions: refer to practice specific booklet Discharge to: home Follow-up Information    Follow up with Sunshine Mackowski A, MD. Call in 6 weeks.   Contact information:   [address] [place] [address] [phone]          Newborn Data: Live born female  Birth Weight: 5 lb 9.8 oz (2545 g) APGAR: 9, 9  Home with mother.  Lowanda Cashaw A 05/14/2011, 6:20 AM

## 2011-05-15 ENCOUNTER — Encounter (HOSPITAL_COMMUNITY): Payer: Self-pay | Admitting: Obstetrics

## 2013-08-26 ENCOUNTER — Encounter: Payer: Self-pay | Admitting: Gynecology

## 2013-08-26 ENCOUNTER — Other Ambulatory Visit (HOSPITAL_COMMUNITY)
Admission: RE | Admit: 2013-08-26 | Discharge: 2013-08-26 | Disposition: A | Discharge status: Home / Self Care | Payer: 59 | Source: Ambulatory Visit | Attending: Gynecology | Admitting: Gynecology

## 2013-08-26 ENCOUNTER — Ambulatory Visit (INDEPENDENT_AMBULATORY_CARE_PROVIDER_SITE_OTHER): Payer: Commercial Managed Care - PPO | Admitting: Gynecology

## 2013-08-26 VITALS — BP 124/82 | Ht 61.0 in | Wt 165.0 lb

## 2013-08-26 DIAGNOSIS — N390 Urinary tract infection, site not specified: Secondary | ICD-10-CM

## 2013-08-26 DIAGNOSIS — Z01419 Encounter for gynecological examination (general) (routine) without abnormal findings: Secondary | ICD-10-CM

## 2013-08-26 DIAGNOSIS — N318 Other neuromuscular dysfunction of bladder: Secondary | ICD-10-CM

## 2013-08-26 DIAGNOSIS — R6882 Decreased libido: Secondary | ICD-10-CM

## 2013-08-26 DIAGNOSIS — Z1151 Encounter for screening for human papillomavirus (HPV): Secondary | ICD-10-CM | POA: Insufficient documentation

## 2013-08-26 DIAGNOSIS — N3281 Overactive bladder: Secondary | ICD-10-CM

## 2013-08-26 LAB — URINALYSIS W MICROSCOPIC + REFLEX CULTURE
BILIRUBIN URINE: NEGATIVE
CASTS: NONE SEEN
CRYSTALS: NONE SEEN
Glucose, UA: NEGATIVE mg/dL
Hgb urine dipstick: NEGATIVE
KETONES UR: NEGATIVE mg/dL
NITRITE: NEGATIVE
PH: 6 (ref 5.0–8.0)
Protein, ur: NEGATIVE mg/dL
RBC / HPF: NONE SEEN RBC/hpf (ref <3)
SPECIFIC GRAVITY, URINE: 1.025 (ref 1.005–1.030)
Urobilinogen, UA: 0.2 mg/dL (ref 0.0–1.0)

## 2013-08-26 LAB — CBC WITH DIFFERENTIAL/PLATELET
BASOS ABS: 0 10*3/uL (ref 0.0–0.1)
Basophils Relative: 0 % (ref 0–1)
EOS PCT: 2 % (ref 0–5)
Eosinophils Absolute: 0.1 10*3/uL (ref 0.0–0.7)
HEMATOCRIT: 37 % (ref 36.0–46.0)
Hemoglobin: 12.2 g/dL (ref 12.0–15.0)
LYMPHS PCT: 30 % (ref 12–46)
Lymphs Abs: 1.9 10*3/uL (ref 0.7–4.0)
MCH: 26 pg (ref 26.0–34.0)
MCHC: 33 g/dL (ref 30.0–36.0)
MCV: 78.9 fL (ref 78.0–100.0)
MONO ABS: 0.5 10*3/uL (ref 0.1–1.0)
MONOS PCT: 8 % (ref 3–12)
Neutro Abs: 3.8 10*3/uL (ref 1.7–7.7)
Neutrophils Relative %: 60 % (ref 43–77)
Platelets: 240 10*3/uL (ref 150–400)
RBC: 4.69 MIL/uL (ref 3.87–5.11)
RDW: 14.9 % (ref 11.5–15.5)
WBC: 6.3 10*3/uL (ref 4.0–10.5)

## 2013-08-26 MED ORDER — TESTOSTERONE 50 MG/5GM (1%) TD GEL: TRANSDERMAL | Status: DC

## 2013-08-26 MED ORDER — TOLTERODINE TARTRATE ER 4 MG PO CP24: 4.0000 mg | ORAL_CAPSULE | Freq: Every day | ORAL | Status: DC

## 2013-08-26 NOTE — Patient Instructions (Addendum)
Vejiga hiperactiva - Adultos  (Overactive Bladder, Adult)  La vejiga tiene dos funciones que son totalmente opuestas. Una de ellas es relajarse y agrandarse para Psychologist, clinical orina (se llena como un globo), y la otra es contraerse y apretar hacia abajo de modo que pueda vaciar la orina que se ha almacenado. El correcto funcionamiento de la vejiga es una mezcla compleja de [place] funciones. El llenado y vaciado de la vejiga puede estar influenciado por:   La vejiga.  La mdula espinal.  El cerebro.  Los nervios que Zenaida Niece a la vejiga.  Otros rganos estrechamente relacionados con la vejiga, como la prstata en los hombres y la vagina en las mujeres. A medida que la vejiga se llena de orina, enva seales nerviosas al cerebro para informarle que tiene que orinar. La miccin normal requiere que la vejiga se contraiga hacia abajo con la fuerza suficiente como para vaciarla, pero tambin requiere que se contraiga el tiempo suficiente como para finalizar la accin. Adems, los msculos del esfnter, que normalmente impiden las fugas de [place], tambin deben relajarse para que la orina pueda pasar. Es necesaria la coordinacin entre el msculo de la vejiga que empuja hacia abajo y los msculos del esfnter que se relajan para que todo ocurra con normalidad.  En una vejiga hiperactiva los msculos se contraen de forma inesperada e involuntaria y esto provoca la necesidad urgente de Geographical information systems officer. La respuesta normal es tratar de Insurance risk surveyor orina contrayendo los msculos del esfnter. A veces, la vejiga se contrae con tanta fuerza que los msculos del esfnter no pueden impedir que la orina pase y se produce la incontinencia. Este tipo de incontinencia se llama incontinencia a la urgencia.  Cuando la vejiga es hiperactiva puede haber situaciones embarazosas. Puede interferir en vivir la vida de la manera que usted desea. Muchas personas piensan que es algo que deben soportar debido al envejecimiento o a problemas de  [place]. Sin embargo, existen tratamientos que pueden ayudar a Radio producer su vida menos complicada y ms agradable.  CAUSAS  Son [employer] factores que pueden producir una vejiga hiperactiva. Ellos son:   Infeccin del tracto urinario o infeccin de los tejidos cercanos, como la prstata.  Agrandamiento de la prstata.  En las mujeres, los embarazos mltiples o una ciruga en el tero o la uretra.  Clculos , inflamacin o tumores en la vejiga.  La cafena.  El alcohol.  Medicamentos. Por ejemplo, los diurticos (medicamentos que ayudan al cuerpo a Animator del exceso de lquido) aumentan la produccin de [place]. Algunos medicamentos que deben tomarse con mucho lquido.  Debilidad muscular o nerviosa. Esto podra ser el resultado de una lesin de la mdula espinal, un ictus, esclerosis mltiple o enfermedad de Parkinson.  La diabetes puede producir un alto volumen de orina que llena la vejiga tan rpidamente que el impulso normal de orinar se activa muy intensamente. SNTOMAS   Prdida del control de la vejiga. Siente necesidad de Geographical information systems officer y no Sport and exercise psychologist.  Urgencia repentina de orinar.  Orina 8 o ms veces por da.  Levantarse para [employer] o ms veces por noche. DIAGNSTICO  Para diagnosticar vejiga hiperactiva, el mdico probablemente:   Preguntar sobre los sntomas que ha notado.  Preguntar acerca de su salud en general. Incluir preguntas sobre cualquier medicamento que est tomando.  Realizar un examen fsico. Esto ayudar a determinar si hay obstrucciones evidentes u otros problemas.  Indicar algunos exmenes. Pueden incluir:  Un anlisis de sangre para detectar diabetes u otros  problemas de salud que podran estar contribuyendo al problema.  Anlisis de [place]. Incluir la medicin del flujo de [place] y la presin sobre la vejiga.  Un estudio del sistema neurolgico (cerebro, mdula espinal y nervios). Este es el sistema que detecta la necesidad de Geographical information systems officer. Algunas  de [employer] son las pruebas de flujo, pruebas de presin en la vejiga y mediciones elctricas del msculo del Corporate investment banker.  Un estudio de la vejiga para comprobar si se vaca completamente al [employer]oco[employer]ill[employer].  Una citoscopa. En este estudio se [place] un tubo delgado con una pequea cmara. Ofrece la visin del interior de la uretra y la vejiga para ver si hay algn problema.  Diagnstico por imgenes. Le administrarn una sustancia de contraste y [employer] pedirn que orine. Se toman radiografas para ver el funcionamiento de la vejiga. TRATAMIENTO  Una vejiga hiperactiva puede tratarse de [employer]. El tratamiento depende de la causa. Tambin vara si su caso es leve o grave. Generalmente el tratamiento se puede administrar en el consultorio mdico o en la clnica. Asegrese de [employer] opciones con su mdico. Las opciones pueden ser:   Tratamientos conductuales. No incluyen medicamentos ni ciruga:  Entrenamiento de la vejiga. En este caso debe seguir un programa de horarios para orinar a intervalos regulares. Esto le ayudar a aprender a Chief Operating Officer las ganas de Geographical information systems officer. Al principio, es posible que le indiquen que espere unos minutos despus de sentir deseos de Geographical information systems officer. Con el tiempo, usted debe ser capaz de programar ir al bao con ms de una hora de distancia.  Ejercicios de Kegel. Estos ejercicios fortalecen los msculos del piso plvico, que sostienen la vejiga. Al tonificar estos msculos, podr controlar la necesidad de orinar, incluso si los msculos de la vejiga son hiperactivos. Un especialista le ensear cmo hacer estos ejercicios correctamente. Se requerir prctica diaria.  Prdida de peso. Si usted es obeso o tiene sobrepeso, perder peso podra mejorar la vejiga hiperactiva. Hable con su mdico acerca de cuntos kilos debe perder. Tambin pregunte si hay un programa o mtodo especfico que funcione mejor para usted.  Cambio de dieta. Podran sugerirle esta opcin si el  estreimiento empeora su vejiga hiperactiva. El mdico o un nutricionista pueden explicarle las formas de modificar su dieta para Educational psychologist. Otras personas mejorarn si toman menos cafena o alcohol. En algunos casos es necesario beber menos lquidos.  Proteccin. Esto no es Art therapist. Sin embargo, podra usar apsitos especiales para detener eventuales prdidas mientras espera que otros tratamientos sean efectivos. Esto le ayudar a evitar situaciones vergonzantes.  Tratamientos fsicos.  Estimulacin elctrica. Los electrodos envan [employer] a los nervios o a los msculos que ayudan a Scientist, physiological vejiga. El objetivo es fortalecerlos. En algunos casos esto se hace con los electrodos fuera del cuerpo. O bien, pueden colocarlos en el interior del cuerpo (implante). Este tratamiento puede demorar varios meses en surtir [employer].  Medicamentos. stos se utilizan generalmente junto con otros tratamientos. Hay varios medicamentos disponibles. Algunos se inyectan en los msculos que intervienen en la miccin. Otros vienen en forma de pldora. Los medicamentos que se prescriben incluyen:  Anticolinrgicos. Estos medicamentos bloquean las seales que los nervios envan a la vejiga. Impiden la liberacin de la orina cuando no es el momento [place]. Los investigadores consideran que los medicamentos podran ayudar tambin de [place].  Imipramina. Es un antidepresivo. Relaja msculos de la vejiga.  Botox. Todava se encuentra en etapa experimental. Algunas personas creen que al inyectarlo en  los msculos de la vejiga, estos se relajan para que trabajen con mayor normalidad. Tambin se ha inyectado en el msculo del esfnter cuando no se abre correctamente. Sin embargo, se trata de una solucin transitoria. Tambin, podra Dillard's, sobre todo [employer].  Ciruga.  Puede implantarse un dispositivo para ayudar a [employer]. Funciona en los  nervios que envan seales cuando hay necesidad de orinar.  La ciruga a veces es necesaria con Advice worker. Si se implantan electrodos, se hace a travs de la [place].  A veces la reparacin debe hacerse a travs de la [place]. Por ejemplo, el tamao de la vejiga se puede cambiar. Generalmente slo en casos graves. INSTRUCCIONES PARA EL CUIDADO EN EL HOGAR   Tome todos los medicamentos que el mdico le recete o Radiographer, therapeutic. Siga cuidadosamente las indicaciones.  Haga los cambios de estilo de vida que se le indican. Pueden incluir:  Beber menos lquido o beber en diferentes momentos del da. Si necesita orinar con frecuencia durante la noche, por ejemplo, es posible que tenga que dejar de tomar lquidos temprano por la noche.  Reducir el consumo de cafena o alcohol. Ambos pueden empeorar la vejiga hiperactiva. La cafena se encuentra en el caf, el t y [place].  Hacer ejercicios de Kegel para fortalecer los msculos.  Bajar de peso, si se lo indican.  Consuma una dieta saludable y equilibrada. Esto le ayudar a Chief Strategy Officer.  Lleve un diario o un registro. Es posible que se le pida que registre la cantidad que bebe y [place], y tambin cuando se sienta la necesidad de Geographical information systems officer.  Aprenda cmo cuidar los implantes u otros dispositivos, tales como pesarios. SOLICITE ATENCIN MDICA SI:   La vejiga hiperactiva empeora.  Siente dolor o tiene irritacin al [employer]oco[employer]illips.  Observa sangre en la orina.  Tiene preguntas sobre cualquier medicamento o dispositivos que su mdico indique.  Nota sangre, pus o hinchazn en el lugar en que le han realizado las pruebas o procedimientos de [place].  La temperatura oral se eleva sin motivo por arriba de 102F (38,9C). SOLICITE ATENCIN MDICA DE INMEDIATO SI:  La temperatura oral le sube a ms de 102 (38,9C) y no puede bajarla con medicamentos.  Document Released: 05/08/2012 [facility] Patient Information 2014 [place],  [place]. Tolterodine extended-release capsules Qu es este medicamento? La TOLTERODINA se utiliza para tratar la vejiga hiperactiva. Este medicamento puede reducir la necesidad de Geographical information systems officer con frecuencia. Tambin puede ayudar a evitar que una persona se orine accidentalmente. Este medicamento puede ser utilizado para otros usos; si tiene alguna pregunta consulte con su proveedor de atencin mdica o con su farmacutico. MARCAS COMERCIALES DISPONIBLES: Detrol LA Qu le debo informar a mi profesional de la salud antes de tomar este medicamento? Necesita saber si usted presenta alguno de los siguientes problemas o situaciones: -dificultad para Geographical information systems officer -glaucoma -obstruccin intestinal -pulso cardiaco irregular o tiene un miembro de su familia que tiene pulso cardiaco irregular -enfermedad renal -enfermedad heptica -miastenia gravis -una reaccin alrgica o inusual a la tolterodina, fesoterodina, a otros medicamentos, alimentos, colorantes o conservantes -si est embarazada o buscando quedar embarazada -si est amamantando a un beb Cmo debo utilizar este medicamento? Tome este medicamento por va oral con un vaso de agua. Ingiralo entero, no lo triture, corte ni mastique. Siga las instrucciones de la etiqueta del [place]. Tome sus dosis a intervalos regulares. No tome su medicamento con una frecuencia mayor a la indicada. Hable con su pediatra para informarse acerca  del uso de este medicamento en nios. Puede requerir atencin especial. Sobredosis: Pngase en contacto inmediatamente con un centro toxicolgico o una sala de urgencia si usted cree que haya tomado demasiado medicamento. ATENCIN: [employer] es solo para usted. No comparta este medicamento con nadie. Qu sucede si me olvido de una dosis? Si olvida una dosis, tmela lo antes posible. Si es casi la hora de la prxima dosis, tome slo esa dosis. No tome dosis adicionales o dobles. Qu puede interactuar con este  medicamento? -claritromicina -ciclosporina -eritromicina -fluoxetina -medicamentos para infecciones micticas, como fluconazol, itraconazol, quetoconazol o voriconazol -medicamentos para problemas de memoria, como galantamina, donepezil, tacrina -vinblastina Puede ser que esta lista no menciona todas las posibles interacciones. Informe a su profesional de [employer] de [employer] Inc productos a base de hierbas, medicamentos de [place] o suplementos nutritivos que est tomando. Si usted fuma, consume bebidas alcohlicas o si utiliza drogas ilegales, indqueselo tambin a su profesional de [employer]. Algunas sustancias pueden interactuar con su medicamento. A qu debo estar atento al usar P[employer] Corporation? Pueden transcurrir 2  3 meses antes de que obtenga el beneficio mximo de [employer]. Su profesional de Music therapist puede recomendar algunas tcnicas que pueden ayudar a Scientist, clinical (histocompatibility and immunogenetics) el control de su vejiga y de los msculos del Corporate investment banker. Estas tcnicas le ayudarn a reducir la frecuencia con que debe ir al bao. Es posible que sea necesario limitar la ingesta de t, caf, refrescos con cafena y alcohol. Estas bebidas pueden empeorar los sntomas. Si sigue hbitos saludables para los intestinos, los sntomas de la vejiga pueden disminuir. Si fuma, deje de hacerlo; de [employer] a reducir la irritacin de los msculos de la vejiga. Puede experimentar mareos o somnolencia. No conduzca ni utilice maquinaria, ni haga nada que Scientist, research (life sciences) en estado de alerta hasta que sepa cmo le afecta este medicamento. No se siente ni se ponga de pie con rapidez, especialmente si es un paciente de edad avanzada. Esto ayuda a Software engineer de mareos o [place]. Se le podr secar la boca. Masticar chicle sin azcar, chupar caramelos duros y beber agua en abundancia le ayudar a mantener la boca hmeda. Este medicamento puede resecarle los ojos y provocar visin borrosa. Si [place] lentes de  contacto, puede sentir ciertas molestias. Las gotas lubricantes pueden ser tiles. Si el problema no desaparece o es severo, consulte a su mdico de los ojos. Qu efectos secundarios puedo tener al [employer] este medicamento? Efectos secundarios que debe informar a su mdico o a Producer, television/film/video de la salud tan pronto como sea posible: -Therapist, art como erupcin cutnea, picazn o urticarias, hinchazn de la cara, labios o lengua -problemas respiratorios -confusin -dificultad para orinar -pulso cardaco rpido, irregular -alucinaciones -problemas con la memoria -hinchazn de pies, manos Efectos secundarios que, por lo general, no requieren atencin mdica (debe informarlos a su mdico o a su profesional de la salud si persisten o si son molestos): -cambios en la visin -estreimiento -boca, ojos secos -dolor de cabeza -[place], somnolencia -Programme researcher, broadcasting/film/video Puede ser que esta lista no menciona todos los posibles efectos secundarios. Comunquese a su mdico por asesoramiento mdico [employer]lett[employer]ard. Usted puede informar los efectos secundarios a la FDA por telfono al 1-800-FDA-1088. Dnde debo guardar mi medicina? Mantngala fuera del alcance de los nios. Gurdela a San[employer]I, entre 15 y 30 grados C (30 y 43 grados F). Protjala de luz. Deseche los medicamentos que no haya utilizado, despus de la  fecha de vencimiento. ATENCIN: Este folleto es un resumen. Puede ser que no cubra toda la posible informacin. Si usted tiene preguntas acerca de esta medicina, consulte con su mdico, su farmacutico o su profesional de Radiographer, therapeuticla salud.  2014, Elsevier/Gold Standard. (2010-03-30 14:09:16)

## 2013-08-26 NOTE — Progress Notes (Signed)
Christy HamburgerSandra Graham [birth year] [id number]   History:    33 y.o.  for annual gyn exam who is a new patient to the practice had been complaining of urinary frequency and nocturia. Occasional dysuria reported. No stress urinary incontinence. The patient had a gynecological exam at the health department approximately 3 years ago. The patient is a gravida 5 para 4 Ab1 (intrauterine fetal demise at 5316 weeks gestation) her other deliveries were as follows: 3 vaginal and one cesarean section resulting tubal sterilization at the same time. Patient is otherwise having normal menstrual cycles but is complaining also decreased libido.  Past medical history,surgical history, family history and social history were all reviewed and documented in the EPIC chart.  Gynecologic History Patient's last menstrual period was 07/26/2013. Contraception: tubal ligation Last Pap: 3 years ago. Results were: normal Last mammogram: Not indicated. Results were: Not indicated  Obstetric History OB History  Gravida Para Term Preterm AB SAB TAB Ectopic Multiple Living  5 4 4  1 1    4     # Outcome Date GA Lbr Len/2nd Weight Sex Delivery Anes PTL Lv  5 TRM 05/12/11 [id number]  5 lb 9.8 oz (2.545 kg) F LTCS Spinal  Y  4 SAB 07/06/05 6868w0d   F  None  N  3 TRM 02/19/03 [id number]   M SVD None N Y  2 TRM 03/17/99 [id number]   F SVD None N Y  1 TRM 02/18/86 8126w0d   F SVD None N Y       ROS: A ROS was performed and pertinent positives and negatives are included in the history.  GENERAL: No fevers or chills. HEENT: No change in vision, no earache, sore throat or sinus congestion. NECK: No pain or stiffness. CARDIOVASCULAR: No chest pain or pressure. No palpitations. PULMONARY: No shortness of breath, cough or wheeze. GASTROINTESTINAL: No abdominal pain, nausea, vomiting or diarrhea, melena or bright red blood per rectum. GENITOURINARY: No urinary frequency, urgency, hesitancy or dysuria. MUSCULOSKELETAL: No joint or muscle pain, no back  pain, no recent trauma. DERMATOLOGIC: No rash, no itching, no lesions. ENDOCRINE: No polyuria, polydipsia, no heat or cold intolerance. No recent change in weight. HEMATOLOGICAL: No anemia or easy bruising or bleeding. NEUROLOGIC: No headache, seizures, numbness, tingling or weakness. PSYCHIATRIC: No depression, no loss of interest in normal activity or change in sleep pattern.     Exam: chaperone present  BP 124/82  Ht 5\' 1"  (1.549 m)  Wt 165 lb (74.844 kg)  BMI 31.19 kg/m2  LMP 07/26/2013  Body mass index is 31.19 kg/(m^2).  General appearance : Well developed well nourished female. No acute distress HEENT: Neck supple, trachea midline, no carotid bruits, no thyroidmegaly Lungs: Clear to auscultation, no rhonchi or wheezes, or rib retractions  Heart: Regular rate and rhythm, no murmurs or gallops Breast:Examined in sitting and supine position were symmetrical in appearance, no palpable masses or tenderness,  no skin retraction, no nipple inversion, no nipple discharge, no skin discoloration, no axillary or supraclavicular lymphadenopathy Abdomen: no palpable masses or tenderness, no rebound or guarding Extremities: no edema or skin discoloration or tenderness  Pelvic:  Bartholin, Urethra, Skene Glands: Within normal limits             Vagina: No gross lesions or discharge  Cervix: No gross lesions or discharge  Uterus  anteverted, normal size, shape and consistency, non-tender and mobile  Adnexa  Without masses or tenderness  Anus and perineum  normal   Rectovaginal  normal sphincter tone without palpated masses or tenderness             Hemoccult that indicated     Assessment/Plan:  33 y.o. female for annual exam with complaints of decreased libido. She will be prescribed testosterone (Androgel) 1% gel to apply periclitoral when necessary. We are going to check her urine culture since today only demonstrated 3-6 WBCs few bacteria. Pap smear  was done today and the new guidelines  were discussed. The following labs order: CBC, screening cholesterol, compromise metabolic panel, TSH, urinalysis. Patient was reminded her monthly self breast exam.  Note: This dictation was prepared with  Dragon/digital dictation along withSmart phrase technology. Any transcriptional errors that result from this process are unintentional.   Ok Edwards MD, 1:57 PM 08/26/2013

## 2013-08-27 ENCOUNTER — Other Ambulatory Visit: Payer: Self-pay | Admitting: Gynecology

## 2013-08-27 LAB — COMPREHENSIVE METABOLIC PANEL
ALT: 14 U/L (ref 0–35)
AST: 14 U/L (ref 0–37)
Albumin: 4.3 g/dL (ref 3.5–5.2)
Alkaline Phosphatase: 103 U/L (ref 39–117)
BUN: 10 mg/dL (ref 6–23)
CALCIUM: 9.3 mg/dL (ref 8.4–10.5)
CHLORIDE: 105 meq/L (ref 96–112)
CO2: 28 meq/L (ref 19–32)
CREATININE: 0.54 mg/dL (ref 0.50–1.10)
GLUCOSE: 62 mg/dL — AB (ref 70–99)
Potassium: 3.5 mEq/L (ref 3.5–5.3)
Sodium: 140 mEq/L (ref 135–145)
Total Bilirubin: 0.3 mg/dL (ref 0.2–1.2)
Total Protein: 7.2 g/dL (ref 6.0–8.3)

## 2013-08-27 LAB — CHOLESTEROL, TOTAL: Cholesterol: 163 mg/dL (ref 0–200)

## 2013-08-27 LAB — TSH: TSH: 2.622 u[IU]/mL (ref 0.350–4.500)

## 2013-08-27 MED ORDER — TESTOSTERONE 50 MG/5GM (1%) TD GEL: TRANSDERMAL | Status: DC

## 2013-08-27 MED ORDER — TOLTERODINE TARTRATE ER 4 MG PO CP24: 4.0000 mg | ORAL_CAPSULE | Freq: Every day | ORAL | Status: DC

## 2013-08-28 ENCOUNTER — Other Ambulatory Visit: Payer: Self-pay | Admitting: Gynecology

## 2013-08-28 LAB — URINE CULTURE: Colony Count: 50000

## 2013-08-28 MED ORDER — NITROFURANTOIN MONOHYD MACRO 100 MG PO CAPS
100.0000 mg | ORAL_CAPSULE | Freq: Two times a day (BID) | ORAL | Status: DC
Start: 2013-08-28 — End: 2014-01-22

## 2014-01-22 ENCOUNTER — Encounter: Payer: Self-pay | Admitting: Gynecology

## 2014-01-22 ENCOUNTER — Ambulatory Visit (INDEPENDENT_AMBULATORY_CARE_PROVIDER_SITE_OTHER): Payer: Commercial Managed Care - PPO | Admitting: Gynecology

## 2014-01-22 VITALS — BP 116/74

## 2014-01-22 DIAGNOSIS — R102 Pelvic and perineal pain: Secondary | ICD-10-CM

## 2014-01-22 DIAGNOSIS — R140 Abdominal distension (gaseous): Secondary | ICD-10-CM

## 2014-01-22 DIAGNOSIS — N915 Oligomenorrhea, unspecified: Secondary | ICD-10-CM

## 2014-01-22 DIAGNOSIS — R141 Gas pain: Secondary | ICD-10-CM

## 2014-01-22 DIAGNOSIS — N949 Unspecified condition associated with female genital organs and menstrual cycle: Secondary | ICD-10-CM

## 2014-01-22 DIAGNOSIS — R143 Flatulence: Secondary | ICD-10-CM

## 2014-01-22 DIAGNOSIS — R142 Eructation: Secondary | ICD-10-CM

## 2014-01-22 LAB — CBC WITH DIFFERENTIAL/PLATELET
Basophils Absolute: 0 10*3/uL (ref 0.0–0.1)
Basophils Relative: 0 % (ref 0–1)
Eosinophils Absolute: 0.1 10*3/uL (ref 0.0–0.7)
Eosinophils Relative: 1 % (ref 0–5)
HEMATOCRIT: 38.6 % (ref 36.0–46.0)
HEMOGLOBIN: 12.6 g/dL (ref 12.0–15.0)
LYMPHS PCT: 33 % (ref 12–46)
Lymphs Abs: 2 10*3/uL (ref 0.7–4.0)
MCH: 26.1 pg (ref 26.0–34.0)
MCHC: 32.6 g/dL (ref 30.0–36.0)
MCV: 80.1 fL (ref 78.0–100.0)
MONO ABS: 0.3 10*3/uL (ref 0.1–1.0)
MONOS PCT: 5 % (ref 3–12)
NEUTROS ABS: 3.7 10*3/uL (ref 1.7–7.7)
NEUTROS PCT: 61 % (ref 43–77)
Platelets: 239 10*3/uL (ref 150–400)
RBC: 4.82 MIL/uL (ref 3.87–5.11)
RDW: 14.3 % (ref 11.5–15.5)
WBC: 6.1 10*3/uL (ref 4.0–10.5)

## 2014-01-22 LAB — TSH: TSH: 2.283 u[IU]/mL (ref 0.350–4.500)

## 2014-01-22 MED ORDER — MEGESTROL ACETATE 40 MG PO TABS: 40.0000 mg | ORAL_TABLET | Freq: Two times a day (BID) | ORAL | Status: DC

## 2014-01-22 MED ORDER — KETOROLAC TROMETHAMINE 10 MG PO TABS: 10.0000 mg | ORAL_TABLET | Freq: Four times a day (QID) | ORAL | Status: DC | PRN

## 2014-01-22 NOTE — Patient Instructions (Signed)
Ecografa transvaginal (Transvaginal Ultrasound) La ecografa transvaginal es una ecografa plvica en la que se utiliza una probeta metlica que se coloca en la vagina, para observar los rganos femeninos. El ecgrafo enva ondas sonoras desde un transductor (sonda). Estas ondas sonoras chocan contra las estructuras del cuerpo (como un eco) y crean Proofreader. La imagen se observa en un monitor. Se denomina transvaginal debido a que la sonda se inserta dentro de la vagina. Puede haber una pequea molestia por la introduccin de la sonda. Esta prueba tambin puede realizarse [employer] Brands. La ecografa endovaginal es otro nombre para la ecografa transvaginal. En una ecografa transabdominal, la sonda se coloca en la parte externa del abdomen. Este mtodo no ofrece imgenes tan buenas como la tcnica transvaginal. La ecogafa transvaginal se utiliza para observar alteraciones en el tracto genital femenino. Entre ellos se incluyen:  Problemas de infertilidad.  Malformaciones congnitas (defecto de nacimiento) del tero y los ovarios.  Tumores en el tero.  Hemorragias anormales.  Tumores y quistes de ovario.  Abscesos (tejidos inflamados y pus) en la pelvis.  Dolor abdominal o plvico sin causa aparente.  Infecciones plvicas. DURANTE EL EMBARAZO, SE UTILIZA PAR OBSERVAR:  Embarazos normales.  Un embarazo ectpico (embarazo fuera del tero).  Latidos cardacos fetales.  Anormalidades de la pelvis que no se observan bien con la ecografa transabdominal.  Sospecha de gemelos o embarazo mltiple.  Aborto inminente.  Problemas en el cuello del tero (cuello incompetente, no permanece cerrado para contener al beb).  Cuando se realiza una amniocentesis (se retira lquido de la bolsa [place], para ser [place]).  Al buscar anormalidades en el beb.  Para controlar el crecimiento, el desarrollo y la edad del feto.  Para medir la cantidad de lquido en el saco  amnitico.  Cuando se realiza una versin externa del beb (se lo mueve a Programmer, applications).  Evaluar al beb en embarazos de alto riesgo (perfil biofsico).  Si se sospecha el deceso del beb (muerte). En algunos casos, se utiliza un mtodo especial denominado ecografa con infusin salina, para una observacin ms precisa del tero. Se inyecta solucin salina (agua con sal) dentro del tero en pacientes no embarazadas para observar mejor su interior. Este mtodo no se Designer, fashion/clothing. La probeta tambin puede usarse para obtener biopsias de Educational psychologist, para drenar lquido de quistes de ovario y para Designer, jewellery un DIU (dispositivo intrauterino para el control de la natalidad) que no pueda [place]. PREPARACIN PARA LA PRUEBA La ecografa transvaginal se realiza con la vejiga vaca. La ecografa transabdominal se realiza con la vejiga llena. Podrn solicitarle que beba varios vasos de agua antes del examen. En algunos casos se realiza una ecografa transabdominal antes de la ecografa transvaginal para obervar los rganos del abdomen. PROCEDIMIENTO  Deber acostarse en una cama, con las rodillas dobladas y los pies en los estribos. La probeta se cubre con un condn. Dentro de la vagina y en la probeta se aplica un lubricante estril. El lubricante ayuda a transmitir las ondas sonoras y Fish farm manager la irritacin de la vagina. El mdico mover la sonda en el interior de la cavidad vaginal para escanear las estructuras plvicas. Un examen normal mostrar una pelvis normal y contenidos normales en su interior. Una prueba anormal mostrar anormalidades en la pelvis, la placenta o el beb. LAS CAUSAS DE UN RESULTADO ANORMAL PUEDEN SER:  Crecimientos o tumores en:  El tero.  Los ovarios.  La vagina.  Otras estructuras plvicas.  Crecimientos no cancerosos  en el tero y los ovarios.  El ovario se retuerce y se corta el suministro de [place]  Las reas de  infeccin incluyen:  Enfermedad inflamatoria plvica.  Un absceso en la pelvis.  Ubicacin de un DIU. LOS PROBLEMAS QUE PUEDEN HALLARSE EN UNA MUJER EMBARAZADA SON:  Embarazo ectpico (embarazo fuera del tero).  Embarazos mltiples.  Dilatacin (apertura) precoz anormal del cuello del tero. Esto puede indicar un cuello incompetente y Biomedical scientist.  Aborto inminente.  Muerte fetal.  Los problemas con la placenta incluyen:  La placenta se ha desarrollado sobre la abertura del cuello del tero (placenta previa).  La placenta se ha separado anticipadamente en el tero (abrupcin placentaria).  La placenta se desarrolla en el msculo del tero (placenta acreta).  Tumores del Media planner, incluyendo la enfermedad trofoblstica gestacional. Se trata de un embarazo anormal en el que no hay feto. El tero se llena de quistes similares a uvas que en algunos casos son cancerosos.  Posicin incorrecta del feto (de nalgas, de vrtice).  Retraso del desarrollo fetal intrauterino (escaso desarrollo en el tero).  Anormalidades o infeccin fetal. RIESGOS Y COMPLICACIONES No hay riesgos conocidos para la ecografa. No se toman radiografas cuando se realiza una ecografa. Document Released: 09/07/2008 Document Revised: 08/14/2011 [facility] Patient Information 2015 [place]. This information is not intended to replace advice given to you by your health care provider. Make sure you discuss any questions you have with your health care provider.

## 2014-01-22 NOTE — Progress Notes (Signed)
   33 year old patient presented to the office complaining of heavy vaginal bleeding the past 2 days. She had not had a menstrual period since May of this year. She was seen in March of this year for a normal gynecological examination and normal Pap smear. Patient with prior history of tubal sterilization procedure at time of cesarean section.  Her menstrual history is as follows: Last normal menstrual period May 2015 June 2015 no menses July 2015 no menses August has been bleeding for the past 2 days with passage of large clots feeling bloated cramping and left lower quadrant discomfort.  Patient denies any weight gain, patient denies any galactorrhea, patient denies any double vision. Some occasional headache reported.  Exam: Abdomen: Soft nontender no rebound or guarding some tenderness elicited in the left lower abdomen. Pelvic: Bartholin urethra Skene glands within normal limits Vagina: Blood was present the vaginal vault Cervix: No active bleeding noted Bimanual exam uterus anteverted tenderness and questionable fullness in the left adnexa Rectal exam: Not done  Assessment/plan: 33 year old patient with oligomenorrhea and now with menorrhagia. We will check a TSH and prolactin  along with a CBC. She received Toradol 30 mg IM today and will continue with 10 mg by mouth every 6 hours for cramping. She will return back to the office next week for an ultrasound. If her cycle does not subside in the next 48 hours I prescribed her Megace 40 mg to take one by mouth twice a day for 10 days.

## 2014-01-23 LAB — PROLACTIN: PROLACTIN: 15.4 ng/mL

## 2014-02-13 ENCOUNTER — Encounter: Payer: Self-pay | Admitting: Gynecology

## 2014-02-13 ENCOUNTER — Ambulatory Visit (INDEPENDENT_AMBULATORY_CARE_PROVIDER_SITE_OTHER): Payer: Commercial Managed Care - PPO | Admitting: Gynecology

## 2014-02-13 ENCOUNTER — Ambulatory Visit (INDEPENDENT_AMBULATORY_CARE_PROVIDER_SITE_OTHER): Payer: Commercial Managed Care - PPO

## 2014-02-13 DIAGNOSIS — R143 Flatulence: Secondary | ICD-10-CM

## 2014-02-13 DIAGNOSIS — R635 Abnormal weight gain: Secondary | ICD-10-CM

## 2014-02-13 DIAGNOSIS — R142 Eructation: Secondary | ICD-10-CM

## 2014-02-13 DIAGNOSIS — N949 Unspecified condition associated with female genital organs and menstrual cycle: Secondary | ICD-10-CM

## 2014-02-13 DIAGNOSIS — R102 Pelvic and perineal pain: Secondary | ICD-10-CM

## 2014-02-13 DIAGNOSIS — R141 Gas pain: Secondary | ICD-10-CM

## 2014-02-13 DIAGNOSIS — R140 Abdominal distension (gaseous): Secondary | ICD-10-CM

## 2014-02-13 DIAGNOSIS — N915 Oligomenorrhea, unspecified: Secondary | ICD-10-CM

## 2014-02-13 MED ORDER — MEDROXYPROGESTERONE ACETATE 10 MG PO TABS: ORAL_TABLET | ORAL | Status: DC

## 2014-02-13 NOTE — Progress Notes (Signed)
   The patient is a 33 year old gravida 5 para 5 (1 intrauterine fetal demise at 5 months gestation) who presented to the office today complaining of abdominal bloating. Patient has a history of oligomenorrhea. Patient was last seen in the office August 20 of this year. The patient prior to all this had not had a menstrual cycle since May. Patient has had prior history of tubal sterilization at time of her cesarean section. Her menstrual cycles have been as follows:  Her menstrual history is as follows:  Last normal menstrual period May 2015  June 2015 no menses  July 2015 no menses  August has been bleeding for the past 2 days with passage of large clots feeling bloated cramping and left lower quadrant discomfort  At the last office visit on August 20 we checked her TSH and prolactin along with CBC which are all in the normal range. She is seen today for followup ultrasound:  Uterus measures 8.9 x 6.2 x 4.2 cm with endometrial stripe of 6.6 mm. And had 2 fibroids intramural the largest one measuring 17 x 15 mm. A normal no free fluid seen.  Assessment/plan: Overweight patient with oligomenorrhea with normal TSH and prolactin. Patient with previous tubal sterilization procedure. Patient will be prescribed Provera 10 mg to take every 35 days if she does not have a spontaneous menses. We discussed importance of regular exercise and proper diet to help her lose weight as well. Patient otherwise scheduled to return back next year for her annual exam or when necessary.

## 2014-04-06 ENCOUNTER — Encounter: Payer: Self-pay | Admitting: Gynecology

## 2014-09-04 ENCOUNTER — Ambulatory Visit (INDEPENDENT_AMBULATORY_CARE_PROVIDER_SITE_OTHER): Payer: Commercial Managed Care - PPO | Admitting: Gynecology

## 2014-09-04 ENCOUNTER — Encounter: Payer: Self-pay | Admitting: Gynecology

## 2014-09-04 VITALS — BP 115/80 | Ht 61.0 in | Wt 162.0 lb

## 2014-09-04 DIAGNOSIS — N915 Oligomenorrhea, unspecified: Secondary | ICD-10-CM

## 2014-09-04 DIAGNOSIS — R42 Dizziness and giddiness: Secondary | ICD-10-CM | POA: Diagnosis not present

## 2014-09-04 DIAGNOSIS — Z01419 Encounter for gynecological examination (general) (routine) without abnormal findings: Secondary | ICD-10-CM | POA: Diagnosis not present

## 2014-09-04 LAB — COMPREHENSIVE METABOLIC PANEL
ALBUMIN: 4.1 g/dL (ref 3.5–5.2)
ALK PHOS: 97 U/L (ref 39–117)
ALT: 14 U/L (ref 0–35)
AST: 14 U/L (ref 0–37)
BILIRUBIN TOTAL: 0.4 mg/dL (ref 0.2–1.2)
BUN: 10 mg/dL (ref 6–23)
CALCIUM: 8.7 mg/dL (ref 8.4–10.5)
CO2: 24 meq/L (ref 19–32)
CREATININE: 0.42 mg/dL — AB (ref 0.50–1.10)
Chloride: 107 mEq/L (ref 96–112)
GLUCOSE: 89 mg/dL (ref 70–99)
Potassium: 4 mEq/L (ref 3.5–5.3)
Sodium: 139 mEq/L (ref 135–145)
TOTAL PROTEIN: 7 g/dL (ref 6.0–8.3)

## 2014-09-04 LAB — CBC WITH DIFFERENTIAL/PLATELET
BASOS ABS: 0 10*3/uL (ref 0.0–0.1)
Basophils Relative: 0 % (ref 0–1)
Eosinophils Absolute: 0.1 10*3/uL (ref 0.0–0.7)
Eosinophils Relative: 2 % (ref 0–5)
HEMATOCRIT: 37.6 % (ref 36.0–46.0)
HEMOGLOBIN: 12.1 g/dL (ref 12.0–15.0)
LYMPHS PCT: 39 % (ref 12–46)
Lymphs Abs: 2 10*3/uL (ref 0.7–4.0)
MCH: 25.7 pg — ABNORMAL LOW (ref 26.0–34.0)
MCHC: 32.2 g/dL (ref 30.0–36.0)
MCV: 80 fL (ref 78.0–100.0)
MPV: 12 fL (ref 8.6–12.4)
Monocytes Absolute: 0.3 10*3/uL (ref 0.1–1.0)
Monocytes Relative: 6 % (ref 3–12)
NEUTROS ABS: 2.7 10*3/uL (ref 1.7–7.7)
Neutrophils Relative %: 53 % (ref 43–77)
Platelets: 209 10*3/uL (ref 150–400)
RBC: 4.7 MIL/uL (ref 3.87–5.11)
RDW: 13.8 % (ref 11.5–15.5)
WBC: 5.1 10*3/uL (ref 4.0–10.5)

## 2014-09-04 LAB — LIPID PANEL
Cholesterol: 152 mg/dL (ref 0–200)
HDL: 44 mg/dL — AB (ref >=46)
LDL Cholesterol: 86 mg/dL (ref 0–99)
Total CHOL/HDL Ratio: 3.5 Ratio
Triglycerides: 108 mg/dL (ref <150)
VLDL: 22 mg/dL (ref 0–40)

## 2014-09-04 MED ORDER — MEDROXYPROGESTERONE ACETATE 10 MG PO TABS: ORAL_TABLET | ORAL | Status: DC

## 2014-09-04 NOTE — Patient Instructions (Signed)
Presíncope °(Near-Syncope) °El presíncope (comúnmente llamado "casi desmayo") es un estado de debilidad repentina, mareos o sensación de que la persona va a desmayarse. Durante un episodio de presíncope, también puede tener la piel pálida, visión en túnel o ganas de vomitar (náuseas). El presíncope puede ocurrir al levantarse de una silla o al permanecer de pie durante mucho tiempo. La causa es una disminución súbita del flujo de sangre al cerebro. Esta disminución puede ser el resultado de varias causas o disparadores, la mayoría de las cuales no son graves. Sin embargo, debido a que a veces el presíncope puede ser un signo de una afección grave, es necesaria una evaluación médica. Generalmente la causa específica no puede determinarse. °INSTRUCCIONES PARA EL CUIDADO EN EL HOGAR  °Controle su afección para ver si hay cambios. Las siguientes indicaciones ayudarán a aliviar cualquier molestia que pueda sentir: °· Pídale a alguien que se quede con usted hasta que se sienta estable. °· Recuéstese inmediatamente y eleve las piernas si siente que va a desmayarse. Respire profundamente y de manera continua. Espere hasta que los síntomas hayan desaparecido. La mayor parte de los episodios dura unos pocos minutos. Podrá sentirse cansado por varias horas. °· Beba suficiente líquido para mantener la orina clara o de color amarillo pálido. °· Si toma medicamentos para la presión arterial o para el corazón, levántese lentamente si está sentado o acostado. Tómese algunos minutos para permanecer sentado y luego párese. Esto puede reducir los mareos. °· Concurra a las consultas de control con su médico según las indicaciones. °SOLICITE ATENCIÓN MÉDICA DE INMEDIATO SI:  °· Sufre un dolor intenso de cabeza. °· Siente un dolor intenso inusual en el pecho, el abdomen o la espalda. °· Tiene un sangrado por la boca o el recto, o la materia fecal es de color negro o aspecto alquitranado. °· Siente latidos irregulares o muy  rápidos. °· Sufre episodios de desmayo repetidos o temblores como sacudidas durante un episodio. °· Se desmaya mientras se encuentra sentado o acostado. °· Se siente confundido. °· Tiene problemas para caminar. °· Siente debilidad intensa. °· Tiene problemas de visión. °ASEGÚRESE DE QUE:  °· Comprende estas instrucciones. °· Controlará su afección. °· Recibirá ayuda de inmediato si no mejora o si empeora. °Document Released: 05/22/2005 Document Revised: 05/27/2013 °ExitCare® Patient Information ©2015 ExitCare, LLC. This information is not intended to replace advice given to you by your health care provider. Make sure you discuss any questions you have with your health care provider. ° °

## 2014-09-04 NOTE — Progress Notes (Signed)
Christy Graham [birth year] [id number]   History:    34 y.o.  for annual gyn exam who has a history of oligomenorrhea. Patient takes Provera 10 mg for 10 days of the month when she does not menstruate spontaneously every 30 days. Patient has been complaining over the past month of feeling lightheaded at different times during the day or week. She denies any loss of consciousness. She was normotensive today. Patient also last year had been diagnosed with overactive bladder and was started on Detrol she is no longer taking it and is having no complaints from this. She has had a previous tubal ligation. Patient stated that approximately 9 years ago she had some form of abnormal Pap smear at another medical facility and was diagnosed with dysplasia and was followed but no treatment that she can recall and her subsequent Pap smears have all been normal.  Past medical history,surgical history, family history and social history were all reviewed and documented in the EPIC chart.  Gynecologic History Patient's last menstrual period was 08/22/2014. Contraception: tubal ligation Last Pap: 2015. Results were: normal Last mammogram: Not indicated. Results were: Not indicated  Obstetric History OB History  Gravida Para Term Preterm AB SAB TAB Ectopic Multiple Living  # Outcome Date GA Lbr Len/2nd Weight Sex Delivery Anes PTL Lv  5 Term 05/12/11 [redacted]w[redacted]d  5 lb 9.8 oz (2.545 kg) F CS-LTranv Spinal  Y  4 SAB 07/06/05 [redacted]w[redacted]d   F  None  N  3 Term 02/19/03 [redacted]w[redacted]d   M Vag-Spont None N Y  2 Term 03/17/99 [redacted]w[redacted]d   F Vag-Spont None N Y  1 Term 02/18/86 [redacted]w[redacted]d   F Vag-Spont None N Y       ROS: A ROS was performed and pertinent positives and negatives are included in the history.  GENERAL: No fevers or chills. HEENT: No change in vision, no earache, sore throat or sinus congestion. NECK: No pain or stiffness. CARDIOVASCULAR: No chest pain or pressure. No palpitations. PULMONARY: No shortness  of breath, cough or wheeze. GASTROINTESTINAL: No abdominal pain, nausea, vomiting or diarrhea, melena or bright red blood per rectum. GENITOURINARY: No urinary frequency, urgency, hesitancy or dysuria. MUSCULOSKELETAL: No joint or muscle pain, no back pain, no recent trauma. DERMATOLOGIC: No rash, no itching, no lesions. ENDOCRINE: No polyuria, polydipsia, no heat or cold intolerance. No recent change in weight. HEMATOLOGICAL: No anemia or easy bruising or bleeding. NEUROLOGIC: No headache, seizures, numbness, tingling or weakness. PSYCHIATRIC: No depression, no loss of interest in normal activity or change in sleep pattern.     Exam: chaperone present  BP 115/80 mmHg  Ht  (1.549 m)  Wt 162 lb (73.483 kg)  BMI 30.63 kg/m2  LMP 08/22/2014  Breastfeeding? No  Body mass index is 30.63 kg/(m^2).  General appearance : Well developed well nourished female. No acute distress HEENT: Eyes: no retinal hemorrhage or exudates,  Neck supple, trachea midline, no carotid bruits, no thyroidmegaly Lungs: Clear to auscultation, no rhonchi or wheezes, or rib retractions  Heart: Regular rate and rhythm, no murmurs or gallops Breast:Examined in sitting and supine position were symmetrical in appearance, no palpable masses or tenderness,  no skin retraction, no nipple inversion, no nipple discharge, no skin discoloration, no axillary or supraclavicular lymphadenopathy Abdomen: no palpable masses or tenderness, no rebound or guarding Extremities: no edema or skin discoloration or tenderness  Pelvic:  Bartholin, Urethra, Skene Glands:  Within normal limits             Vagina: Menstrual blood  Cervix: No gross lesions or discharge  Uterus  anteverted, normal size, shape and consistency, non-tender and mobile  Adnexa  Without masses or tenderness  Anus and perineum  normal   Rectovaginal  normal sphincter tone without palpated masses or tenderness             Hemoccult not indicated     Assessment/Plan:   34 y.o. female for annual exam with history of oligomenorrhea has done well by taking the Provera for 10 days each month when she does not menstruate spontaneously. We are going to recheck her TSH today and also because of her dizziness we'll also check a prolactin level. I'm going to recommend that she increase her fluid intake over the next week and if she still feels the dizziness episodes I'm going to refer her to the neurologist for further evaluation. The following screening labs were ordered: CBC, comprehensive metabolic panel, fasting lipid profile, and urinalysis. Pap smear not done in accordance to the new guidelines. Patient was reminded to do her monthly breast exams.   Ok EdwardsFERNANDEZ,JUAN H MD, 9:54 AM 09/04/2014

## 2014-09-05 LAB — URINALYSIS W MICROSCOPIC + REFLEX CULTURE
BILIRUBIN URINE: NEGATIVE
CASTS: NONE SEEN
CRYSTALS: NONE SEEN
GLUCOSE, UA: NEGATIVE mg/dL
KETONES UR: NEGATIVE mg/dL
LEUKOCYTES UA: NEGATIVE
Nitrite: NEGATIVE
PH: 6.5 (ref 5.0–8.0)
Protein, ur: NEGATIVE mg/dL
SPECIFIC GRAVITY, URINE: 1.018 (ref 1.005–1.030)
SQUAMOUS EPITHELIAL / LPF: NONE SEEN
Urobilinogen, UA: 0.2 mg/dL (ref 0.0–1.0)

## 2014-09-05 LAB — PROLACTIN: PROLACTIN: 10.7 ng/mL

## 2014-09-05 LAB — TSH: TSH: 2.439 u[IU]/mL (ref 0.350–4.500)

## 2014-09-07 ENCOUNTER — Other Ambulatory Visit: Payer: Self-pay | Admitting: Gynecology

## 2014-09-07 MED ORDER — NITROFURANTOIN MONOHYD MACRO 100 MG PO CAPS: 100.0000 mg | ORAL_CAPSULE | Freq: Two times a day (BID) | ORAL | Status: DC

## 2014-09-08 LAB — URINE CULTURE: Colony Count: >=100000

## 2015-06-06 HISTORY — PX: EYE SURGERY: SHX253

## 2015-09-16 ENCOUNTER — Encounter: Payer: Commercial Managed Care - PPO | Admitting: Gynecology

## 2015-10-13 ENCOUNTER — Ambulatory Visit (INDEPENDENT_AMBULATORY_CARE_PROVIDER_SITE_OTHER): Payer: Commercial Managed Care - PPO | Admitting: Gynecology

## 2015-10-13 ENCOUNTER — Encounter: Payer: Self-pay | Admitting: Gynecology

## 2015-10-13 VITALS — BP 118/76 | Ht 60.5 in | Wt 159.2 lb

## 2015-10-13 DIAGNOSIS — Z01419 Encounter for gynecological examination (general) (routine) without abnormal findings: Secondary | ICD-10-CM

## 2015-10-13 DIAGNOSIS — L304 Erythema intertrigo: Secondary | ICD-10-CM

## 2015-10-13 MED ORDER — NYSTATIN-TRIAMCINOLONE 100000-0.1 UNIT/GM-% EX CREA: 1.0000 "application " | TOPICAL_CREAM | Freq: Two times a day (BID) | CUTANEOUS | Status: DC

## 2015-10-13 NOTE — Patient Instructions (Signed)
Nystatin; Triamcinolone cream or ointment Qu es este medicamento? La NISTATINA; TRIAMCINOLONA es una combinacin de un antimictico y corticosteroide. Se utiliza para tratar ciertos tipos de infecciones micticas o por levadura de la piel. Este medicamento puede ser utilizado para otros usos; si tiene alguna pregunta consulte con su proveedor de atencin mdica o con su farmacutico. Qu le debo informar a mi profesional de la salud antes de tomar este medicamento? Necesita saber si usted presenta alguno de los siguientes problemas o situaciones: -grandes zonas de piel quemada o con lesiones -desgaste o adelgazamiento de la piel -enfermedad vascular perifrica o mala circulacin -una reaccin alrgica o inusual a la nistatina, a la triamcinolona, a otros corticosteroides, a otros medicamentos, alimentos, colorantes o conservantes -si est embarazada o buscando quedar embarazada -si est amamantando a un beb Cmo debo utilizar este medicamento? Este medicamento es slo para uso externo. No lo ingiera por va oral. Siga las instrucciones de la etiqueta del medicamento. Lvese las manos antes y despus de usarlo. Si est tratando una infeccin de la mano o uas, slo lvese las manos antes de usarlo. Aplique una capa delgada del medicamento sobre la zona afectada y friccione suavemente. No lo utilice sobre la piel sana o sobre grandes zonas de la piel. Evite que el medicamento entre en contacto con sus ojos. Si esto ocurre, enjuguelos con abundante agua fra del grifo. Cuando lo aplique en el rea de la ingle, use una cantidad limitada y no lo utilice durante ms de 2 semanas a menos que su mdico o su profesional de la salud le indique lo contrario. No cubra ni envuelva la zona tratada con un vendaje hermtico (por ejemplo, un vendaje plstico). Complete todo el tratamiento con el medicamento recetado, aun cuando considere que la infeccin ha mejorado. Utilcelo a intervalos regulares. No utilice el  medicamento con una frecuencia mayor a la indicada. No utilice este medicamento para ningn otro problema que no sea para el cual le fue recetado. Hable con su pediatra para informarse acerca del uso de este medicamento en nios. Aunque este medicamento ha sido recetado para condiciones selectivas, las precauciones se aplican. Los nios recibiendo tratamiento para una irritacin causada por paales, no deben usar paales ajustados ni calzones de plstico. Sobredosis: Pngase en contacto inmediatamente con un centro toxicolgico o una sala de urgencia si usted cree que haya tomado demasiado medicamento. ATENCIN: [employer] es solo para usted. No comparta este medicamento con nadie. Qu sucede si me olvido de una dosis? Si olvida una dosis, sela tan pronto como sea posible. Si es casi la hora de la prxima dosis, use slo esa dosis. No use dosis dobles o adicionales. Qu puede interactuar con este medicamento? No se esperan interacciones. No utilice otros productos para la piel sobre las zonas afectadas sin Science writer a su mdico o a su profesional de Radiographer, therapeutic. Puede ser que esta lista no menciona todas las posibles interacciones. Informe a su profesional de [employer] de [employer] productos a base de hierbas, medicamentos de [place] o suplementos nutritivos que est tomando. Si usted fuma, consume bebidas alcohlicas o si utiliza drogas ilegales, indqueselo tambin a su profesional de [employer]. Algunas sustancias pueden interactuar con su medicamento. A qu debo estar atento al usar [employer]? Si los sntomas no mejoran en 1 semana cuando est tratando el rea de la ingle o en 2 semanas cuando est tratando los pies, consulte a su mdico o a su profesional de [employer]. Informe a su  mdico o a su profesional de la salud si desarrolla llagas o ampollas que no cicatrizan adecuadamente. Si la infeccin de la piel vuelve a aparecer despus de dejar de SLM Corporationutilizar este medicamento, comunquese  con su mdico o su profesional de Beazer Homesla salud. Si est utilizando este medicamento para una infeccin en el rea de la ingle, no utilice ropa interior [place]ajustada o hecha con fibras sintticas, tales como rayn o nylon. Use ropa interior holgada y de algodn. Adems, seque completamente el rea afectada despus de baarse. Qu efectos secundarios puedo tener al Boston Scientificutilizar este medicamento? Efectos secundarios que debe informar a su mdico o a Producer, television/film/videosu profesional de la salud tan pronto como sea posible: -ardor o picazn de la piel -manchas rojas oscuras en la piel -prdida de la sensibilidad de la piel -formacin de ampollas llenas de pus, rojas y dolorosas en los folculos pilosos -infeccin de la piel -adelgazamiento de la piel o quemaduras de sol: ms probables si se aplica la pomada o crema en la cara Efectos secundarios que, por lo general, no requieren atencin mdica (debe informarlos a su mdico o a su profesional de la salud si persisten o si son molestos): -piel seca o descamada -irritacin de la piel Puede ser que esta lista no menciona todos los posibles efectos secundarios. Comunquese a su mdico por asesoramiento mdico Hewlett-Packardsobre los efectos secundarios. Usted puede informar los efectos secundarios a la FDA por telfono al 1-800-FDA-1088. Dnde debo guardar mi medicina? [place] fuera del alcance de los nios. Gurdela a Sanmina-SCItemperatura ambiente, entre 15 y 30 grados C (7559 y 6086 grados F). No la congele. Deseche todos los medicamentos que no haya utilizado, despus de la fecha de vencimiento. ATENCIN: Este folleto es un resumen. Puede ser que no cubra toda la posible informacin. Si usted tiene preguntas acerca de esta medicina, consulte con su mdico, su farmacutico o su profesional de Radiographer, therapeuticla salud.    2016, Elsevier/Gold Standard. (2014-07-14 00:00:00) Intertrigo (Intertrigo) El intertrigo es una irritacin de la piel (inflamacin) que aparece en las zonas calientes y hmedas del cuerpo.  Generalmente se produce entre los pliegues de la piel o en los lugares en que la piel se roza. CUIDADOS EN EL HOGAR  Mantenga la zona limpia y [place]seca.  Deje los pliegues de la piel abiertos al aire.  Ponga algodn o un lienzo Ryder Systementre los pliegues.  Evite la ropa Indonesiaajustada.  Use zapatos abiertos o sandalias.  Use un polvo medicinal en la zona afectada, o lo que le indique el mdico.  Slo utilice las cremas o ungentos medicinales que le haya indicado el mdico. SOLICITE AYUDA DE INMEDIATO SI:  La erupcin no mejora luego de una semana de [place] Pinestratamiento.  La irritacin empeora.  Tiene fiebreo escalofros. ASEGRESE DE QUE:  Comprende estas instrucciones.  Controlar su enfermedad.  Solicitar ayuda de inmediato si no mejora o empeora.   Esta informacin no tiene Theme park managercomo fin reemplazar el consejo del mdico. Asegrese de hacerle al mdico cualquier pregunta que tenga.   Document Released: 09/06/2010 Document Revised: 10/06/2014 Elsevier Interactive Patient Education [employer]2016 Elsevier Inc.

## 2015-10-13 NOTE — Progress Notes (Signed)
Lamae Fosco May 06, 1981 [id number]   History:    35 y.o.  for annual gyn exam with complaining of a vaginal rash between her thighs. She also since last year on and off as compared plain of occasional lightheadedness especially when she goes long appears or time without a meal. Patient stated that approximately 9 years ago she had some form of abnormal Pap smear at another medical facility and was diagnosed with dysplasia and was followed but no treatment that she can recall and her subsequent Pap smears have all been normal. Patient has had overactive bladder no longer taking medication no complaints today.   Past medical history,surgical history, family history and social history were all reviewed and documented in the EPIC chart.  Gynecologic History Patient's last menstrual period was 09/19/2015. Contraception: none Last Pap: 2015. Results were: normal Last mammogram: Not indicated. Results were: Not indicated  Obstetric History OB History  Gravida Para Term Preterm AB SAB TAB Ectopic Multiple Living  # Outcome Date GA Lbr Len/2nd Weight Sex Delivery Anes PTL Lv  5 Term 05/12/11 [redacted]w[redacted]d  5 lb 9.8 oz (2.545 kg) F CS-LTranv Spinal  Y  4 SAB 07/06/05 [redacted]w[redacted]d   F  None  N  3 Term 02/19/03 [redacted]w[redacted]d   M Vag-Spont None N Y  2 Term 03/17/99 [redacted]w[redacted]d   F Vag-Spont None N Y  1 Term 02/18/86 [redacted]w[redacted]d   F Vag-Spont None N Y       ROS: A ROS was performed and pertinent positives and negatives are included in the history.  GENERAL: No fevers or chills. HEENT: No change in vision, no earache, sore throat or sinus congestion. NECK: No pain or stiffness. CARDIOVASCULAR: No chest pain or pressure. No palpitations. PULMONARY: No shortness of breath, cough or wheeze. GASTROINTESTINAL: No abdominal pain, nausea, vomiting or diarrhea, melena or bright red blood per rectum. GENITOURINARY: No urinary frequency, urgency, hesitancy or dysuria. MUSCULOSKELETAL: No joint or muscle pain, no  back pain, no recent trauma. DERMATOLOGIC: No rash, no itching, no lesions. ENDOCRINE: No polyuria, polydipsia, no heat or cold intolerance. No recent change in weight. HEMATOLOGICAL: No anemia or easy bruising or bleeding. NEUROLOGIC: No headache, seizures, numbness, tingling or weakness. PSYCHIATRIC: No depression, no loss of interest in normal activity or change in sleep pattern.     Exam: chaperone present  BP 118/76 mmHg  Ht 5' 0.5" (1.537 m)  Wt 159 lb 3.2 oz (72.213 kg)  BMI 30.57 kg/m2  LMP 09/19/2015  Body mass index is 30.57 kg/(m^2).  General appearance : Well developed well nourished female. No acute distress HEENT: Eyes: no retinal hemorrhage or exudates,  Neck supple, trachea midline, no carotid bruits, no thyroidmegaly Lungs: Clear to auscultation, no rhonchi or wheezes, or rib retractions  Heart: Regular rate and rhythm, no murmurs or gallops Breast:Examined in sitting and supine position were symmetrical in appearance, no palpable masses or tenderness,  no skin retraction, no nipple inversion, no nipple discharge, no skin discoloration, no axillary or supraclavicular lymphadenopathy Abdomen: no palpable masses or tenderness, no rebound or guarding Extremities: Rash bilateral mid thighs consistent with intertrigo  Pelvic:  Bartholin, Urethra, Skene Glands: Within normal limits             Vagina: No gross lesions or discharge  Cervix: No gross lesions or discharge  Uterus  anteverted, normal size, shape and consistency, non-tender and mobile  Adnexa  Without masses  or tenderness  Anus and perineum  normal   Rectovaginal  normal sphincter tone without palpated masses or tenderness             Hemoccult not indicated     Assessment/Plan:  35 y.o. female for annual exam patient with thigh intertrigo will be prescribed mytrex cream to apply twice a day for 7-10 days. Patient to return back next week in a fasting state for the following screening blood work: Fasting lipid  profile, comprehensive metabolic panel, TSH, CBC and urinalysis. Patient was instructed to eat as neck in between meals at work if her symptoms continue dizziness or lightheadedness she is to contact the office so we can refer to the neurologist for further evaluation. Pap smear not done this year.   Ok EdwardsFERNANDEZ,JUAN H MD, 11:18 AM 10/13/2015

## 2015-10-18 ENCOUNTER — Other Ambulatory Visit: Payer: Commercial Managed Care - PPO

## 2015-10-18 ENCOUNTER — Other Ambulatory Visit: Payer: Self-pay | Admitting: Anesthesiology

## 2015-10-18 DIAGNOSIS — Z01419 Encounter for gynecological examination (general) (routine) without abnormal findings: Secondary | ICD-10-CM

## 2015-10-18 LAB — TSH: TSH: 3.34 m[IU]/L

## 2015-10-18 LAB — CBC WITH DIFFERENTIAL/PLATELET
BASOS PCT: 0 %
Basophils Absolute: 0 cells/uL (ref 0–200)
EOS PCT: 1 %
Eosinophils Absolute: 85 cells/uL (ref 15–500)
HCT: 39.3 % (ref 35.0–45.0)
HEMOGLOBIN: 12.5 g/dL (ref 11.7–15.5)
LYMPHS PCT: 39 %
Lymphs Abs: 3315 cells/uL (ref 850–3900)
MCH: 26.1 pg — ABNORMAL LOW (ref 27.0–33.0)
MCHC: 31.8 g/dL — AB (ref 32.0–36.0)
MCV: 82 fL (ref 80.0–100.0)
MPV: 11.4 fL (ref 7.5–12.5)
Monocytes Absolute: 510 cells/uL (ref 200–950)
Monocytes Relative: 6 %
NEUTROS PCT: 54 %
Neutro Abs: 4590 cells/uL (ref 1500–7800)
Platelets: 253 10*3/uL (ref 140–400)
RBC: 4.79 MIL/uL (ref 3.80–5.10)
RDW: 14.4 % (ref 11.0–15.0)
WBC: 8.5 10*3/uL (ref 3.8–10.8)

## 2015-10-18 LAB — COMPLETE METABOLIC PANEL WITH GFR
ALBUMIN: 3.9 g/dL (ref 3.6–5.1)
ALT: 11 U/L (ref 6–29)
AST: 10 U/L (ref 10–30)
Alkaline Phosphatase: 62 U/L (ref 33–115)
BUN: 10 mg/dL (ref 7–25)
CALCIUM: 8.6 mg/dL (ref 8.6–10.2)
CHLORIDE: 105 mmol/L (ref 98–110)
CO2: 21 mmol/L (ref 20–31)
CREATININE: 0.51 mg/dL (ref 0.50–1.10)
GFR, Est African American: >89 mL/min (ref >=60)
GFR, Est Non African American: >89 mL/min (ref >=60)
Glucose, Bld: 79 mg/dL (ref 65–99)
Potassium: 4.1 mmol/L (ref 3.5–5.3)
Sodium: 138 mmol/L (ref 135–146)
Total Bilirubin: 0.2 mg/dL (ref 0.2–1.2)
Total Protein: 6.4 g/dL (ref 6.1–8.1)

## 2015-10-18 LAB — LIPID PANEL
CHOL/HDL RATIO: 3 ratio (ref <=5.0)
CHOLESTEROL: 151 mg/dL (ref 125–200)
HDL: 50 mg/dL (ref >=46)
LDL Cholesterol: 77 mg/dL (ref <130)
Triglycerides: 122 mg/dL (ref <150)
VLDL: 24 mg/dL (ref <30)

## 2016-04-25 ENCOUNTER — Ambulatory Visit (INDEPENDENT_AMBULATORY_CARE_PROVIDER_SITE_OTHER): Payer: Commercial Managed Care - PPO | Admitting: Family Medicine

## 2016-04-25 VITALS — BP 116/80 | HR 88 | Temp 98.6°F | Resp 17 | Ht 60.5 in | Wt 166.0 lb

## 2016-04-25 DIAGNOSIS — R42 Dizziness and giddiness: Secondary | ICD-10-CM | POA: Diagnosis not present

## 2016-04-25 DIAGNOSIS — R51 Headache: Secondary | ICD-10-CM | POA: Diagnosis not present

## 2016-04-25 DIAGNOSIS — R519 Headache, unspecified: Secondary | ICD-10-CM

## 2016-04-25 LAB — COMPLETE METABOLIC PANEL WITH GFR
ALBUMIN: 4 g/dL (ref 3.6–5.1)
ALK PHOS: 88 U/L (ref 33–115)
ALT: 12 U/L (ref 6–29)
AST: 14 U/L (ref 10–30)
BUN: 11 mg/dL (ref 7–25)
CALCIUM: 9.1 mg/dL (ref 8.6–10.2)
CO2: 25 mmol/L (ref 20–31)
CREATININE: 0.58 mg/dL (ref 0.50–1.10)
Chloride: 106 mmol/L (ref 98–110)
GFR, Est African American: >89 mL/min (ref >=60)
GFR, Est Non African American: >89 mL/min (ref >=60)
GLUCOSE: 92 mg/dL (ref 65–99)
Potassium: 3.9 mmol/L (ref 3.5–5.3)
Sodium: 139 mmol/L (ref 135–146)
TOTAL PROTEIN: 7 g/dL (ref 6.1–8.1)
Total Bilirubin: 0.3 mg/dL (ref 0.2–1.2)

## 2016-04-25 LAB — CBC WITH DIFFERENTIAL/PLATELET
Basophils Absolute: 0 cells/uL (ref 0–200)
Basophils Relative: 0 %
EOS PCT: 1 %
Eosinophils Absolute: 70 cells/uL (ref 15–500)
HCT: 38.6 % (ref 35.0–45.0)
HEMOGLOBIN: 12.3 g/dL (ref 11.7–15.5)
LYMPHS ABS: 1960 {cells}/uL (ref 850–3900)
LYMPHS PCT: 28 %
MCH: 25.8 pg — ABNORMAL LOW (ref 27.0–33.0)
MCHC: 31.9 g/dL — ABNORMAL LOW (ref 32.0–36.0)
MCV: 80.9 fL (ref 80.0–100.0)
MPV: 10.9 fL (ref 7.5–12.5)
Monocytes Absolute: 560 cells/uL (ref 200–950)
Monocytes Relative: 8 %
Neutro Abs: 4410 cells/uL (ref 1500–7800)
Neutrophils Relative %: 63 %
PLATELETS: 230 10*3/uL (ref 140–400)
RBC: 4.77 MIL/uL (ref 3.80–5.10)
RDW: 14.2 % (ref 11.0–15.0)
WBC: 7 10*3/uL (ref 3.8–10.8)

## 2016-04-25 LAB — THYROID PANEL WITH TSH
Free Thyroxine Index: 2.3 (ref 1.4–3.8)
T3 Uptake: 30 % (ref 22–35)
T4 TOTAL: 7.6 ug/dL (ref 4.5–12.0)
TSH: 1.38 mIU/L

## 2016-04-25 MED ORDER — NAPROXEN 500 MG PO TABS: 500.0000 mg | ORAL_TABLET | Freq: Two times a day (BID) | ORAL | 0 refills | Status: DC

## 2016-04-25 MED ORDER — TIZANIDINE HCL 6 MG PO CAPS: 6.0000 mg | ORAL_CAPSULE | Freq: Three times a day (TID) | ORAL | 0 refills | Status: DC | PRN

## 2016-04-25 NOTE — Progress Notes (Signed)
Patient ID: Christy HamburgerSandra Montes-Moreno, female    DOB: 12-05-1980, 35 y.o.   MRN: [id number]  PCP: No primary care provider on file.  Chief Complaint  Patient presents with  . Headache    Started yesterday     Subjective:   HPI 35 year old female presents for evaluation of bilateral headache with dizziness times 2 days. Reports awakening with headache and dizziness such that the room is spinning. Denies visual disturbances. Reports that she has already had menstrual cycle. She recalls have a short period of similar dizziness a long time ago without a headache. Denies nasal congestion or ear pain. Dizziness worst with lying down, although is persistent occurring simultaneously with headache. Rates headache 8/10.  Social History   Social History  . Marital status: Married    Spouse name: N/A  . Number of children: N/A  . Years of education: N/A   Occupational History  . Not on file.   Social History Main Topics  . Smoking status: Never Smoker  . Smokeless tobacco: Not on file  . Alcohol use 0.0 oz/week     Comment: RARE  . Drug use: No  . Sexual activity: Yes     Comment: TUBAL LIGATION    Other Topics Concern  . Not on file   Social History Narrative  . No narrative on file   Family History  Problem Relation Age of Onset  . Diabetes Mother     Review of Systems See HPI There are no active problems to display for this patient.    Prior to Admission medications   Medication Sig Start Date End Date Taking? Authorizing Provider  medroxyPROGESTERone (PROVERA) 10 MG tablet Take one tablet daily for ten days of the month if no spontaneous menses q 35 days Patient not taking: Reported on 04/25/2016 09/04/14   Ok EdwardsJuan H Fernandez, MD  nitrofurantoin, macrocrystal-monohydrate, (MACROBID) 100 MG capsule Take 1 capsule (100 mg total) by mouth 2 (two) times daily. Patient not taking: Reported on 04/25/2016 09/07/14   Ok EdwardsJuan H Fernandez, MD  tolterodine (DETROL LA) 4 MG 24 hr capsule  Take 1 capsule (4 mg total) by mouth daily. Patient not taking: Reported on 04/25/2016 08/27/13   Ok EdwardsJuan H Fernandez, MD   No Known Allergies     Objective:  Physical Exam  Constitutional: She appears well-developed and well-nourished.  HENT:  Head: Normocephalic and atraumatic.  Neck: Normal range of motion. Neck supple. No thyromegaly present.  Cardiovascular: Normal rate and regular rhythm.   Pulmonary/Chest: Effort normal.  Musculoskeletal: Normal range of motion.  Lymphadenopathy:    She has no cervical adenopathy.  Neurological: She is alert. She has normal strength. No sensory deficit. She displays a negative Romberg sign. GCS eye subscore is 4. GCS verbal subscore is 5. GCS motor subscore is 6.  Cerebellar function intact. Negative for nystagmus. Strength bilateral upper extremities 5/5.  Skin: Skin is warm and dry.  Psychiatric: She has a normal mood and affect. Her behavior is normal. Judgment and thought content normal.         Vitals:   04/25/16 1337  BP: 116/80  Pulse: 88  Resp: 17  Temp: 98.6 F (37 C)   Assessment & Plan:  1. Acute intractable headache, unspecified headache type - CBC with Differential/Platelet - COMPLETE METABOLIC PANEL WITH GFR - Thyroid Panel With TSH  Plan: Start tizanidine (Zanaflex) 6 mg up to three times daily as needed. Take naproxen 500 mg with food as needed twice daily.  2. Dizziness, likely related to headache. Will screen for metabolic causes.  Return for follow-up if symptoms worsen or do not improve. Will consider further evaluation by neurology vs vestibular rehabilitation if dizziness persists.  Godfrey PickKimberly S. Tiburcio PeaHarris, MSN, FNP-C [facility]

## 2016-04-25 NOTE — Patient Instructions (Addendum)
Headache Start Zanaflex 6 mg as needed up to 3 times daily. Take Naproxen 500 mg with food twice daily until headache resolves. To relieve dizziness, increase water intake, change positions slowly.  If dizziness, continues after headache resolves, return for further evaluation. You will be contacted regarding your lab results.   IF you received an x-ray today, you will receive an invoice from [facility]. Please contact [facility] at [phone] with questions or concerns regarding your invoice.   IF you received labwork today, you will receive an invoice from [employer]. Please contact [facility] at [phone] with questions or concerns regarding your invoice.   Our billing staff will not be able to assist you with questions regarding bills from these companies.  You will be contacted with the lab results as soon as they are available. The fastest way to get your results is to activate your My Chart account. Instructions are located on the last page of this paperwork. If you have not heard from [place] regarding the results in 2 weeks, please contact this office.     Mareos (Dizziness) Los mareos son un problema muy frecuente. Se trata de una sensacin de inestabilidad o de desvanecimiento. Puede sentir que se va a desmayar. Un mareo puede provocarle una lesin si se tropieza o se cae. Las Dealer de todas las edades pueden sufrir Research scientist (life sciences), Biomedical engineer es ms frecuente en los adultos [place]. Esta afeccin puede tener muchas causas, entre las que se pueden [employer]ant, la deshidratacin y [place]. INSTRUCCIONES PARA EL CUIDADO EN EL HOGAR Estas indicaciones pueden ayudarlo con el trastorno: Comida y bebida   Beba suficiente lquido para Pharmacologist la orina clara o de color amarillo plido. Esto evita la deshidratacin. Trate de beber ms lquidos transparentes, como agua.  No beba alcohol.  Limite el consumo de cafena si el mdico  se lo indica.  Limite el consumo de sal si el mdico se lo indica. Actividad   Evite los movimientos rpidos.  Levntese de las sillas con lentitud y apyese hasta sentirse bien.  Por la maana, sintese primero a un lado de la cama. Cuando se sienta bien, pngase lentamente de [address] se sostiene de algo, hasta que sepa que ha logrado el equilibrio.  Mueva las piernas con frecuencia si debe estar de pie en un lugar durante mucho tiempo. Mientras est de pie, contraiga y relaje los msculos de las piernas.  No conduzca vehculos ni opere maquinaria pesada si se siente mareado.  Evite agacharse si se siente mareado. En su casa, coloque los objetos de modo que le resulte fcil alcanzarlos sin Public librarian. Estilo de vida   No consuma ningn producto que contenga tabaco, lo que incluye cigarrillos, tabaco de Theatre manager o Administrator, Civil Service. Si necesita ayuda para dejar de fumar, consulte al [employer].  Trate de reducir el nivel de estrs practicando actividades como el yoga o la meditacin. Hable con el mdico si necesita ayuda. Instrucciones generales   Controle sus mareos para ver si hay cambios.  Tome los medicamentos solamente como se lo haya indicado el mdico. Hable con el mdico si cree que los medicamentos que est tomando son la causa de sus mareos.  Infrmele a un amigo o a un familiar si se siente mareado. Pdale a esta persona que llame al mdico si observa cambios en su comportamiento.  Concurra a todas las visitas de control como se lo haya indicado el mdico. Esto es importante. SOLICITE ATENCIN MDICA SI:  Los [employer] Express.  Los Golden West Financialmareos o la sensacin de Production assistant, radiodesvanecimiento empeoran.  Siente nuseas.  Ha perdido la audicin.  Aparecen nuevos sntomas.  Cuando est de pie se siente inestable o que la habitacin da vueltas. SOLICITE ATENCIN MDICA DE INMEDIATO SI:  Vomita o tiene diarrea y no puede comer ni beber nada.  Tiene dificultad para hablar, para  caminar, para tragar o para Boeingusar los brazos, las manos o las piernas.  Siente una debilidad generalizada.  No piensa con claridad o tiene dificultades para armar oraciones. Es posible que un amigo o un familiar adviertan que esto ocurre.  Tiene dolor de pecho, dolor abdominal, sudoracin o Company secretaryle falta el aire.  Hay cambios en la visin.  Observa un sangrado.  Tiene dolores de Turkmenistancabeza.  Tiene dolor o rigidez en el cuello.  Tiene fiebre. Esta informacin no tiene Theme park managercomo fin reemplazar el consejo del mdico. Asegrese de hacerle al mdico cualquier pregunta que tenga. Document Released: 05/22/2005 Document Revised: 10/06/2014 Document Reviewed: 05/18/2014 Elsevier Interactive Patient Education  2017 Elsevier Inc.  Dolor de cabeza general sin causa (General Headache Without Cause) El dolor de cabeza es un dolor o Dentistmalestar que se siente en la zona de la cabeza o del cuello. Puede no tener una causa especfica. Hay muchas causas y tipos de dolores de Turkmenistancabeza. Los dolores de cabeza ms comunes son los siguientes:  Cefalea tensional.  Cefaleas migraosas.  Cefalea en brotes.  Cefaleas diarias crnicas. INSTRUCCIONES PARA EL CUIDADO EN EL HOGAR Controle su afeccin para ver si hay cambios. Siga estos pasos para Scientist, physiologicalcontrolar la afeccin: Control del News Corporationdolor   Tome los medicamentos de venta libre y los recetados solamente como se lo haya indicado el mdico.  Cuando sienta dolor de cabeza acustese en un cuarto oscuro y tranquilo.  Si se lo indican, aplique hielo sobre la cabeza y la zona del cuello:  Ponga el hielo en una bolsa plstica.  Coloque una toalla entre la piel y la bolsa de hielo.  Coloque el hielo durante 20minutos, 2 a 3veces por Futures traderda.  Utilice una almohadilla trmica o tome una ducha con agua caliente para aplicar calor en la cabeza y la zona del cuello como se lo haya indicado el mdico.  Mantenga las luces tenues si le Liz Claibornemolesta las luces brillantes o sus dolores de cabeza  empeoran. Comida y bebida   Mantenga un horario para las comidas.  Limite el consumo de bebidas alcohlicas.  Consuma menos cantidad de cafena o deje de tomarla. Instrucciones generales   Concurra a todas las visitas de control como se lo haya indicado el mdico. Esto es importante.  Lleve un diario de los dolores de cabeza para Financial risk analystaveriguar qu factores pueden desencadenarlos. Por ejemplo, escriba los siguientes datos:  Lo que usted come y Estate agentbebe.  Cunto tiempo duerme.  Algn cambio en su dieta o en los medicamentos.  Pruebe algunas tcnicas de relajacin, como los Wacomasajes.  Limite el estrs.  Sintese con la espalda recta y no tense los msculos.  No consuma productos que contengan tabaco, incluidos cigarrillos, tabaco de Theatre managermascar o cigarrillos electrnicos. Si necesita ayuda para dejar de fumar, consulte al mdico.  Haga actividad fsica habitualmente como se lo haya indicado el mdico.  Tenga un horario fijo para dormir. Duerma entre 7 y 9horas o la cantidad de horas que le haya recomendado el mdico. SOLICITE ATENCIN MDICA SI:  Los medicamentos no Materials engineerlogran aliviar los sntomas.  Tiene un dolor de cabeza que es diferente del dolor de cabeza habitual.  Foye Deeriene  nuseas o vmitos.  Tiene fiebre. SOLICITE ATENCIN MDICA DE INMEDIATO SI:  El dolor se hace cada vez ms intenso.  Ha vomitado repetidas veces.  Presenta rigidez en el cuello.  Sufre prdida de la visin.  Tiene problemas para hablar.  Siente dolor en el ojo o en el odo.  Presenta debilidad muscular o prdida del control muscular.  Pierde el equilibrio o tiene problemas para Advertising account plannercaminar.  Sufre mareos o se desmaya.  Se siente confundido. Esta informacin no tiene Theme park managercomo fin reemplazar el consejo del mdico. Asegrese de hacerle al mdico cualquier pregunta que tenga. Document Released: 03/01/2005 Document Revised: 02/10/2015 Document Reviewed: 09/14/2014 Elsevier Interactive Patient Education  2017  [employer]vier Inc.

## 2016-04-26 ENCOUNTER — Encounter: Payer: Self-pay | Admitting: Family Medicine

## 2016-07-03 ENCOUNTER — Other Ambulatory Visit: Payer: Self-pay | Admitting: Gynecology

## 2016-07-03 ENCOUNTER — Encounter: Payer: Self-pay | Admitting: Gynecology

## 2016-07-03 ENCOUNTER — Ambulatory Visit (INDEPENDENT_AMBULATORY_CARE_PROVIDER_SITE_OTHER): Payer: Commercial Managed Care - PPO | Admitting: Gynecology

## 2016-07-03 VITALS — BP 118/76

## 2016-07-03 DIAGNOSIS — R350 Frequency of micturition: Secondary | ICD-10-CM | POA: Diagnosis not present

## 2016-07-03 DIAGNOSIS — R300 Dysuria: Secondary | ICD-10-CM | POA: Diagnosis not present

## 2016-07-03 DIAGNOSIS — N3001 Acute cystitis with hematuria: Secondary | ICD-10-CM

## 2016-07-03 LAB — URINALYSIS W MICROSCOPIC + REFLEX CULTURE
Bilirubin Urine: NEGATIVE
CASTS: NONE SEEN [LPF]
CRYSTALS: NONE SEEN [HPF]
Glucose, UA: NEGATIVE
KETONES UR: NEGATIVE
LEUKOCYTES UA: NEGATIVE
Nitrite: NEGATIVE
PH: 6 (ref 5.0–8.0)
PROTEIN: NEGATIVE
Specific Gravity, Urine: 1.02 (ref 1.001–1.035)
YEAST: NONE SEEN [HPF]

## 2016-07-03 MED ORDER — PHENAZOPYRIDINE HCL 200 MG PO TABS: 200.0000 mg | ORAL_TABLET | Freq: Three times a day (TID) | ORAL | 0 refills | Status: DC | PRN

## 2016-07-03 MED ORDER — NITROFURANTOIN MONOHYD MACRO 100 MG PO CAPS: 100.0000 mg | ORAL_CAPSULE | Freq: Two times a day (BID) | ORAL | 0 refills | Status: DC

## 2016-07-03 NOTE — Addendum Note (Signed)
Addended by: Kem ParkinsonBARNES, Saraiya Kozma on: 07/03/2016 10:18 AM   Modules accepted: Orders

## 2016-07-03 NOTE — Progress Notes (Signed)
   Patient is a 36 year old who presented to the office today with complaining this morning having suprapubic discomfort dysuria frequency. She just finished her menstrual cycle yesterday. She's had a previous tubal ligation. Patient denies any fever, chills, nausea, vomiting but some mild back discomfort.  Exam: Back: Mild right CVA tenderness Abdomen: Suprapubic tenderness but no rebound or guarding Pelvic: Bartholin urethra Skene was within normal limits Vagina: No menstrual blood Cervix: Potential mucus noted Uterus: Anteverted tender suprapubic Adnexa: No palpable mass or tenderness Rectal exam not done  Urinalysis: 20-40 RBC, many bacteria culture submitted  Assessment/plan: Clinical evidence of urinary tract infection will be treated with Macrobid one by mouth twice a day for 7 days and Pyridium 200 mg 3 times a day for 3 days. Patient was reminded to make an appointment in May for her annual exam.

## 2016-07-03 NOTE — Patient Instructions (Signed)
Infección de las vías urinarias en los adultos  (Urinary Tract Infection, Adult)  Una infección urinaria (IU) es una infección en cualquier parte de las vías urinarias, que incluyen los riñones, los uréteres, la vejiga y la uretra. Estos órganos fabrican, almacenan y eliminan la orina del organismo. La IU puede ser una infección de la vejiga (cistitis) o infección de los riñones (pielonefritis).  CAUSAS  Esta infección puede ser causada por hongos, virus o bacterias. Las bacterias son las causas más comunes de las IU. Esta afección también puede ser provocada por no vaciar la vejiga por completo durante la micción en repetidas ocasiones.  FACTORES DE RIESGO  Es más probable que esta afección se manifieste si:  · Usted ignora la necesidad de orinar o retiene la orina durante largos períodos.  · No vacía la vejiga completamente durante la micción.  · Se limpia de atrás hacia adelante después de orinar o defecar, en el caso de que sea mujer.  · Está circuncidado, en el caso de que sea varón.  · Tiene estreñimiento.  · Tiene colocada una sonda urinaria permanente.  · Tiene debilitado el sistema de defensa (inmunitario) del cuerpo.  · Tiene una enfermedad que afecta los intestinos, los riñones o la vejiga.  · Tiene diabetes.  · Toma antibióticos con frecuencia o durante largos períodos, y los antibióticos ya no resultan eficaces para combatir algunos tipos de infecciones (resistencia a los antibióticos).  · Toma medicamentos que le irritan las vías urinarias.  · Está expuesto a sustancias químicas que le irritan las vías urinarias.  · Es mujer.  SÍNTOMAS  Los síntomas de esta afección incluyen lo siguiente:  · Fiebre.  · Micción frecuente o eliminación de pequeñas cantidades de orina con frecuencia.  · Necesidad urgente de orinar.  · Ardor o dolor al orinar.  · Orina con mal olor u olor atípico.  · Orina turbia.  · Dolor en la parte baja del abdomen o en la espalda.  · Dificultad para orinar.  · Sangre en la  orina.  · Vómitos o más apetito de lo normal.  · Diarrea o dolor abdominal.  · Secreción vaginal, si es mujer.  DIAGNÓSTICO  Esta afección se diagnostica mediante sus antecedentes médicos y un examen físico. También deberá proporcionar una muestra de orina para realizar análisis. Podrán indicarle otros estudios, por ejemplo:  · Análisis de sangre.  · Pruebas de detección de enfermedades de transmisión sexual (ETS).  Si ha tenido más de una IU, se pueden hacer estudios de diagnóstico por imágenes o una citoscopia para determinar la causa de las infecciones.  TRATAMIENTO  El tratamiento de esta afección suele incluir una combinación de dos o más de los siguientes:  · Antibióticos.  · Otros medicamentos para tratar las causas menos frecuentes de infección urinaria.  · Medicamentos de venta libre para aliviar el dolor.  · Consumo de la cantidad necesaria de agua para mantenerse hidratado.  INSTRUCCIONES PARA EL CUIDADO EN EL HOGAR  · Tome los medicamentos de venta libre y los recetados solamente como se lo haya indicado el médico.  · Si le recetaron un antibiótico, tómelo como se lo haya indicado el médico. No deje de tomar los antibióticos aunque comience a sentirse mejor.  · Evite el alcohol, la cafeína, el té y las bebidas gaseosas. Estas sustancias pueden irritar la vejiga.  · Beba suficiente líquido para mantener la orina clara o de color amarillo pálido.  · Concurra a todas las visitas de control como se   lo haya indicado el médico. Esto es importante.  · Asegúrese de lo siguiente:  ? Vaciar la vejiga con frecuencia y en su totalidad. No contener la orina durante largos períodos.  ? Vaciar la vejiga antes y después de tener relaciones sexuales.  ? Limpiar de adelante hacia atrás después de defecar, si es mujer. Usar cada trozo de papel una vez cuando se limpie.    SOLICITE ATENCIÓN MÉDICA SI:  · Siente dolor en la espalda.  · Tiene fiebre.  · Siente náuseas o vomita.  · Los síntomas no mejoran después de 3 días de  tratamiento.  · Los síntomas desaparecen y luego reaparecen.    SOLICITE ATENCIÓN MÉDICA DE INMEDIATO SI:  · Siente dolor intenso en la espalda o en la zona inferior del abdomen.  · Tiene vómitos y no puede tragar medicamentos ni agua.    Esta información no tiene como fin reemplazar el consejo del médico. Asegúrese de hacerle al médico cualquier pregunta que tenga.  Document Released: 03/01/2005 Document Revised: 09/13/2015 Document Reviewed: 04/12/2015  Elsevier Interactive Patient Education © 2017 Elsevier Inc.

## 2016-07-06 LAB — URINE CULTURE

## 2016-10-13 ENCOUNTER — Encounter: Payer: Self-pay | Admitting: Gynecology

## 2016-10-13 ENCOUNTER — Ambulatory Visit (INDEPENDENT_AMBULATORY_CARE_PROVIDER_SITE_OTHER): Payer: Commercial Managed Care - PPO | Admitting: Gynecology

## 2016-10-13 ENCOUNTER — Encounter: Payer: Commercial Managed Care - PPO | Admitting: Gynecology

## 2016-10-13 VITALS — BP 102/68 | Ht 61.0 in | Wt 170.2 lb

## 2016-10-13 DIAGNOSIS — R350 Frequency of micturition: Secondary | ICD-10-CM | POA: Diagnosis not present

## 2016-10-13 DIAGNOSIS — B9689 Other specified bacterial agents as the cause of diseases classified elsewhere: Secondary | ICD-10-CM | POA: Diagnosis not present

## 2016-10-13 DIAGNOSIS — N760 Acute vaginitis: Secondary | ICD-10-CM | POA: Diagnosis not present

## 2016-10-13 DIAGNOSIS — N898 Other specified noninflammatory disorders of vagina: Secondary | ICD-10-CM

## 2016-10-13 DIAGNOSIS — Z01419 Encounter for gynecological examination (general) (routine) without abnormal findings: Secondary | ICD-10-CM

## 2016-10-13 LAB — URINALYSIS W MICROSCOPIC + REFLEX CULTURE
BILIRUBIN URINE: NEGATIVE
CASTS: NONE SEEN [LPF]
CRYSTALS: NONE SEEN [HPF]
Glucose, UA: NEGATIVE
KETONES UR: NEGATIVE
Leukocytes, UA: NEGATIVE
NITRITE: NEGATIVE
Protein, ur: NEGATIVE
Specific Gravity, Urine: 1.025 (ref 1.001–1.035)
WBC, UA: NONE SEEN WBC/HPF (ref <=5)
YEAST: NONE SEEN [HPF]
pH: 5.5 (ref 5.0–8.0)

## 2016-10-13 LAB — LIPID PANEL
CHOLESTEROL: 171 mg/dL (ref <200)
HDL: 47 mg/dL — AB (ref >50)
LDL Cholesterol: 97 mg/dL (ref <100)
TRIGLYCERIDES: 137 mg/dL (ref <150)
Total CHOL/HDL Ratio: 3.6 Ratio (ref <5.0)
VLDL: 27 mg/dL (ref <30)

## 2016-10-13 LAB — WET PREP FOR TRICH, YEAST, CLUE
Trich, Wet Prep: NONE SEEN
Yeast Wet Prep HPF POC: NONE SEEN

## 2016-10-13 MED ORDER — METRONIDAZOLE 500 MG PO TABS: 500.0000 mg | ORAL_TABLET | Freq: Two times a day (BID) | ORAL | 0 refills | Status: DC

## 2016-10-13 NOTE — Patient Instructions (Signed)
Metronidazole tablets or capsules Qu es este medicamento? El METRONIDAZOL es un antiinfeccioso. Se utiliza en el tratamiento de ciertos tipos de infecciones bacterianas y por protozoos. No es efectivo para resfros, gripe u otras infecciones de origen viral. Este medicamento puede ser utilizado para otros usos; si tiene alguna pregunta consulte con su proveedor de atencin mdica o con su farmacutico. MARCAS COMUNES: Flagyl Qu le debo informar a mi profesional de la salud antes de tomar este medicamento? Necesita saber si usted presenta alguno de los siguientes problemas o situaciones: -anemia u otros trastornos sanguneos -enfermedad del sistema nervioso -infeccin mictica o por levadura -si consume bebidas alcohlicas -enfermedad heptica -convulsiones -una reaccin alrgica o inusual al metronidazol, a otros medicamentos, alimentos, colorantes o conservantes -si est embarazada o buscando quedar embarazada -si est amamantando a un beb Cmo debo utilizar este medicamento? Tome este medicamento por va oral con un vaso lleno de agua. Siga las instrucciones de la etiqueta del medicamento. Tome sus dosis a intervalos regulares. No tome su medicamento con una frecuencia mayor a la indicada. Complete todo el tratamiento con el medicamento como recetado aun si se siente mejor. No omita ninguna dosis o suspenda el uso de su medicamento antes de lo indicado. Hable con su pediatra para informarse acerca del uso de este medicamento en nios. Puede requerir atencin especial. Sobredosis: Pngase en contacto inmediatamente con un centro toxicolgico o una sala de urgencia si usted cree que haya tomado demasiado medicamento. ATENCIN: Este medicamento es solo para usted. No comparta este medicamento con nadie. Qu sucede si me olvido de una dosis? Si olvida una dosis, tmela lo antes posible. Si es casi la hora de la prxima dosis, tome slo esa dosis. No tome dosis adicionales o dobles. Qu  puede interactuar con este medicamento? No tome este medicamento con ninguno de los siguientes frmacos: alcohol o cualquier producto que contenga alcohol amprenavir solucin oral cisaprida disulfiram dofetilida dronedarona paclitaxel inyeccin pimozida ritonavir solucin oral sertralina solucin oral sulfametoxasol-trimetoprima inyeccin tioridazina ziprasidona Este medicamento tambin puede interactuar con los siguientes medicamentos: pldoras anticonceptivas cimetidina litio otros medicamentos que prolongan el intervalo QT (causan un ritmo cardiaco anormal) fenobarbital fenitona warfarina Puede ser que esta lista no menciona todas las posibles interacciones. Informe a su profesional de la salud de todos los productos a base de hierbas, medicamentos de venta libre o suplementos nutritivos que est tomando. Si usted fuma, consume bebidas alcohlicas o si utiliza drogas ilegales, indqueselo tambin a su profesional de la salud. Algunas sustancias pueden interactuar con su medicamento. A qu debo estar atento al usar este medicamento? Consulte a su mdico o a su profesional de la salud si sus sntomas no mejoran o si empeoran. Puede experimentar mareos o somnolencia. No conduzca ni utilice maquinaria ni haga nada que le exija permanecer en estado de alerta hasta que sepa cmo le afecta este medicamento. No se siente ni se ponga de pie con rapidez, especialmente si es un paciente de edad avanzada. Esto reduce el riesgo de mareos o desmayos. Evite las bebidas alcohlicas durante el tratamiento con este medicamento y durante los tres das siguientes. El alcohol puede causarle mareos o hacerlo sentir enfermo o ruborizado. Si est recibiendo tratamiento para una enfermedad de transmisin sexual, no tenga relaciones sexuales hasta que haya completado el tratamiento. Es posible que su pareja tambin necesite tratamiento. Qu efectos secundarios puedo tener al utilizar este medicamento? Efectos secundarios que  debe informar a su mdico o a su profesional de la salud tan pronto como sea   posible: -reacciones alrgicas como erupcin cutnea o urticarias, hinchazn de la cara, labios o lengua -confusin, torpeza -dificultad para hablar -mareos -decoloracin o dolor de la boca -fiebre, infeccin -entumecimiento, hormigueo, dolor o debilidad en las manos o los pies -dificultad para orinar o cambios en el volumen de orina -enrojecimiento, formacin de ampollas, descamacin o distensin de la piel, inclusive dentro de la boca -convulsiones -cansancio o debilidad inusual -irritacin, resequedad o flujo de la vagina Efectos secundarios que, por lo general, no requieren atencin mdica (debe informarlos a su mdico o a su profesional de la salud si persisten o si son molestos): -diarrea -dolor de cabeza -irritabilidad -sabor metlico -nuseas -calambres o dolores estomacales -dificultad para conciliar el sueo Puede ser que esta lista no menciona todos los posibles efectos secundarios. Comunquese a su mdico por asesoramiento mdico [employer] Usted puede informar los efectos secundarios a la FDA por telfono al 1-800-FDA-1088. Dnde debo guardar mi medicina? Mantngala fuera del alcance de los nios. Gurdela a [employer], menos de 25 grados C (77 grados F). Protjala de la luz. Mantenga el envase bien cerrado. Deseche todo el medicamento que no haya utilizado, despus de la fecha de vencimiento. ATENCIN: Este folleto es un resumen. Puede ser que no cubra toda la posible informacin. Si usted tiene preguntas acerca de esta medicina, consulte con su mdico, su farmacutico o su profesional de Technical sales engineer.  2018 Elsevier/Gold Standard (2016-06-22 00:00:00) Vaginosis bacteriana (Bacterial Vaginosis) La vaginosis bacteriana es una infeccin vaginal que perturba el equilibrio normal de las bacterias que se encuentran en la vagina. Es el resultado de un crecimiento excesivo de  ciertas bacterias. Esta es la infeccin vaginal ms frecuente en mujeres en edad reproductiva. El tratamiento es importante para prevenir complicaciones, especialmente en mujeres embarazadas, dado que puede causar un parto prematuro. CAUSAS La vaginosis bacteriana se origina por un aumento de bacterias nocivas que, generalmente, estn presentes en cantidades ms pequeas en la vagina. Varios tipos diferentes de bacterias pueden causar esta afeccin. Sin embargo, la causa de su desarrollo no se comprende totalmente. [place] o comportamientos pueden exponerlo a un mayor riesgo de desarrollar vaginosis bacteriana, entre los que se incluyen:  Tener una nueva pareja sexual o mltiples parejas sexuales.  Las duchas vaginales  El uso del DIU (dispositivo intrauterino) como mtodo anticonceptivo. El contagio no se produce en baos, por ropas de cama, en piscinas o por contacto con objetos. SIGNOS Y SNTOMAS Algunas mujeres que padecen vaginosis bacteriana no presentan signos ni sntomas. Los sntomas ms comunes son:  Secrecin vaginal de color grisceo.  Secrecin vaginal con olor similar al [employer], especialmente despus de Retail banker.  Picazn o sensacin de ardor en la vagina o la vulva.  Ardor o dolor al [employer]. DIAGNSTICO Su mdico analizar su historia clnica y le examinar la vagina para detectar signos de vaginosis bacteriana. Puede tomarle Truddie Coco de flujo vaginal. Su mdico examinar esta muestra con un microscopio para controlar las bacterias y clulas anormales. Tambin puede realizarse un anlisis del pH vaginal. TRATAMIENTO La vaginosis bacteriana puede tratarse con antibiticos, en forma de comprimidos o de crema vaginal. Puede indicarse una segunda tanda de antibiticos si la afeccin se repite despus del tratamiento. Debido a que la vaginosis bacteriana aumenta el riesgo de contraer enfermedades de transmisin sexual, el  tratamiento puede ayudar a reducir el riesgo de clamidia, [place], VIH y herpes. [place] solo medicamentos de [place] o  recetados, segn las indicaciones del mdico.  Si le han recetado antibiticos, tmelos como se le indic. Asegrese de que finaliza la prescripcin completa aunque se sienta mejor.  Comunique a sus compaeros sexuales que sufre una infeccin vaginal. Deben consultar a su mdico y recibir tratamiento si tienen problemas, como picazn o una erupcin cutnea leve.  Durante el [place], es importante que siga estas indicaciones:  Visual merchandiser relaciones sexuales o use preservativos de la forma correcta.  No se haga duchas vaginales.  Evite consumir alcohol como se lo haya indicado el mdico.  Community education officer se lo haya indicado el mdico. SOLICITE ATENCIN MDICA SI:  Sus sntomas no mejoran despus de 3 das de [place].  Aumenta la secrecin o Conservation officer, historic buildings.  Tiene fiebre. ASEGRESE DE QUE:  Comprende estas instrucciones.  Controlar su afeccin.  Recibir ayuda de inmediato si no mejora o si empeora. PARA OBTENER MS INFORMACIN Centros para el control y la prevencin de Probation officer for Disease Control and Prevention, CDC): AppraiserFraud.fi Asociacin Estadounidense de la Salud Sexual (American Sexual Health Association, SHA): www.ashastd.org Esta informacin no tiene Marine scientist el consejo del mdico. Asegrese de hacerle al mdico cualquier pregunta que tenga. Document Released: 08/29/2007 Document Revised: 06/12/2014 Document Reviewed: 01/01/2013 Elsevier Interactive Patient Education  2017 [employer].

## 2016-10-13 NOTE — Addendum Note (Signed)
Addended by: Lear NgMARTIN, MISTY L on: 10/13/2016 09:47 AM   Modules accepted: Orders

## 2016-10-13 NOTE — Progress Notes (Addendum)
Christy Graham 04/28/1981 [id number]   History:    36 y.o.  for annual gyn exam with a complaining of some sensation of not completing voiding completely but no true dysuria. Review of her record indicates she was treated for an enterococcus urinary tract infection in January this year. She also states that a few days before her cycle she has a clear discharge with no odor with the exception the past week she's had a vaginal discharge which was clear again with no odor. She is in a monogamous relationship. Patient stated that approximately 10 years ago in another medical facility she was diagnosed with dysplasia and and was followed up with Pap smears with no treatment and resolved spontaneously with follow-up Pap smear be normal.  Past medical history,surgical history, family history and social history were all reviewed and documented in the EPIC chart.  Gynecologic History Patient's last menstrual period was 10/05/2016 (exact date). Contraception: Tubal Ligation Last Pap: 2015. Results were: normal Last mammogram: Not indicated. Results were: normal  Obstetric History OB History  Gravida Para Term Preterm AB Living  5 4 4   1 4   SAB TAB Ectopic Multiple Live Births  1       4    # Outcome Date GA Lbr Len/2nd Weight Sex Delivery Anes PTL Lv  5 Term 05/12/11 473w0d  5 lb 9.8 oz (2.545 kg) F CS-LTranv Spinal  LIV  4 SAB 07/06/05 2953w0d   F  None  DEC  3 Term 02/19/03 6965w0d   M Vag-Spont None N LIV  2 Term 03/17/99 5565w0d   F Vag-Spont None N LIV  1 Term 02/18/86 2065w0d   F Vag-Spont None N LIV       ROS: A ROS was performed and pertinent positives and negatives are included in the history.  GENERAL: No fevers or chills. HEENT: No change in vision, no earache, sore throat or sinus congestion. NECK: No pain or stiffness. CARDIOVASCULAR: No chest pain or pressure. No palpitations. PULMONARY: No shortness of breath, cough or wheeze. GASTROINTESTINAL: No abdominal pain, nausea,  vomiting or diarrhea, melena or bright red blood per rectum. GENITOURINARY: No urinary frequency, urgency, hesitancy or dysuria. MUSCULOSKELETAL: No joint or muscle pain, no back pain, no recent trauma. DERMATOLOGIC: No rash, no itching, no lesions. ENDOCRINE: No polyuria, polydipsia, no heat or cold intolerance. No recent change in weight. HEMATOLOGICAL: No anemia or easy bruising or bleeding. NEUROLOGIC: No headache, seizures, numbness, tingling or weakness. PSYCHIATRIC: No depression, no loss of interest in normal activity or change in sleep pattern.     Exam: chaperone present  BP 102/68   Ht 5\' 1"  (1.549 m)   Wt 170 lb 3.2 oz (77.2 kg)   LMP 10/05/2016 (Exact Date)   BMI 32.16 kg/m   Body mass index is 32.16 kg/m.  General appearance : Well developed well nourished female. No acute distress HEENT: Eyes: no retinal hemorrhage or exudates,  Neck supple, trachea midline, no carotid bruits, no thyroidmegaly Lungs: Clear to auscultation, no rhonchi or wheezes, or rib retractions  Heart: Regular rate and rhythm, no murmurs or gallops Breast:Examined in sitting and supine position were symmetrical in appearance, no palpable masses or tenderness,  no skin retraction, no nipple inversion, no nipple discharge, no skin discoloration, no axillary or supraclavicular lymphadenopathy Abdomen: no palpable masses or tenderness, no rebound or guarding Extremities: no edema or skin discoloration or tenderness  Pelvic:  Bartholin, Urethra, Skene Glands: Within normal limits  Vagina: Clear discharge  Cervix: No gross lesions or discharge  Uterus  anteverted, normal size, shape and consistency, non-tender and mobile  Adnexa  Without masses or tenderness  Anus and perineum  normal   Rectovaginal  normal sphincter tone without palpated masses or tenderness             Hemoccult not indicated   Wet prep moderate clue cells, few white blood cell, too numerous to count bacteria  Urinalysis  moderate bacteria no white blood cells seen, 0-RBC urine culture pending  Assessment/Plan:  36 y.o. female for annual exam clinical evidence of bacterial vaginosis will be treated with Flagyl 500 mg one by mouth twice a day for 7 days. Pap smear was done today without HPV according to the guidelines. Patient had her blood work by her PCP in November that there was evidence of no fasting lipid profile which will be drawn today. Patient was encouraged to do her monthly breast exam.   Ok Edwards MD, 9:33 AM 10/13/2016

## 2016-10-14 LAB — URINE CULTURE

## 2016-10-16 LAB — PAP IG W/ RFLX HPV ASCU

## 2016-10-18 ENCOUNTER — Encounter: Payer: Self-pay | Admitting: Gynecology

## 2017-06-11 DIAGNOSIS — D649 Anemia, unspecified: Secondary | ICD-10-CM | POA: Diagnosis not present

## 2017-06-11 DIAGNOSIS — R42 Dizziness and giddiness: Secondary | ICD-10-CM | POA: Diagnosis not present

## 2017-06-11 DIAGNOSIS — R0683 Snoring: Secondary | ICD-10-CM | POA: Diagnosis not present

## 2017-06-11 DIAGNOSIS — Z0000 Encounter for general adult medical examination without abnormal findings: Secondary | ICD-10-CM | POA: Diagnosis not present

## 2017-07-16 ENCOUNTER — Encounter: Payer: Self-pay | Admitting: Neurology

## 2017-07-17 ENCOUNTER — Ambulatory Visit: Payer: Commercial Managed Care - PPO | Admitting: Neurology

## 2017-07-17 ENCOUNTER — Encounter: Payer: Self-pay | Admitting: Neurology

## 2017-07-17 ENCOUNTER — Encounter (INDEPENDENT_AMBULATORY_CARE_PROVIDER_SITE_OTHER): Payer: Self-pay

## 2017-07-17 VITALS — BP 123/82 | HR 85 | Ht 61.0 in | Wt 171.0 lb

## 2017-07-17 DIAGNOSIS — R519 Headache, unspecified: Secondary | ICD-10-CM

## 2017-07-17 DIAGNOSIS — R51 Headache: Secondary | ICD-10-CM | POA: Diagnosis not present

## 2017-07-17 DIAGNOSIS — R0683 Snoring: Secondary | ICD-10-CM

## 2017-07-17 MED ORDER — AMITRIPTYLINE HCL 25 MG PO TABS
25.0000 mg | ORAL_TABLET | Freq: Every evening | ORAL | 3 refills | Status: DC | PRN
Start: 2017-07-17 — End: 2017-10-23

## 2017-07-17 NOTE — Patient Instructions (Signed)
Apnea del sueo (Sleep Apnea) La apnea del sueo es un trastorno que afecta la respiracin. Las personas con apnea del sueo tienen momentos en los que hacen breves pausas al respirar o en los que respiran de forma superficial mientras duermen. La apnea del sueo puede causar estos sntomas:  Dificultad para quedarse dormido.  Sueo o cansancio durante el da.  Irritabilidad.  Ronquidos fuertes.  Dolores de cabeza matutinos.  Dificultad para concentrarse.  Olvido de cosas.  Menos inters sexual.  Somnolencia sin motivo.  Cambios en el estado de nimo.  Cambios en la personalidad.  Depresin.  Muchos despertares nocturnos para orinar.  [place] seca.  Dolor de garganta. CUIDADOS EN EL HOGAR  Haga los cambios en su rutina que le haya recomendado el mdico.  Consuma una dieta sana y bien equilibrada.  Tome los medicamentos de venta libre y los recetados solamente como se lo haya indicado el mdico.  Evite el alcohol, los calmantes (sedantes) y los medicamentos opiceos.  Si tiene sobrepeso, tome medidas para bajar de peso.  Si le proporcionaron un dispositivo para usar mientras duerme, selo solamente como se lo haya indicado el mdico.  No consuma ningn producto que contenga tabaco, lo que incluye cigarrillos, tabaco de mascar y cigarrillos electrnicos. Si necesita ayuda para dejar de fumar, consulte al mdico.  Concurra a todas las visitas de control como se lo haya indicado el mdico. Esto es importante. SOLICITE AYUDA SI:  El dispositivo que recibi para usar mientras duerme es incmodo o parece no funcionar.  Los sntomas no mejoran.  Los sntomas empeoran. SOLICITE AYUDA DE INMEDIATO SI:  Le duele el pecho.  Tiene dificultad para inhalar suficiente aire (falta de aire).  Tiene molestias en la espalda, en los brazos o en el estmago.  Presenta dificultad para hablar.  Siente debilidad en un lado del cuerpo.  Se la cae un lado de la cara. Estos  sntomas pueden indicar una emergencia. No espere hasta que los sntomas desaparezcan. Solicite atencin mdica de inmediato. Comunquese con el servicio de emergencias de su localidad (911 en los [place]). No conduzca por sus propios medios hasta el hospital. Esta informacin no tiene como fin reemplazar el consejo del mdico. Asegrese de hacerle al mdico cualquier pregunta que tenga. Document Released: 06/24/2010 Document Revised: 09/13/2015 Document Reviewed: 03/01/2015 Elsevier Interactive Patient Education  2018 Elsevier Inc.  

## 2017-07-17 NOTE — Progress Notes (Signed)
SLEEP MEDICINE CLINIC   Provider:  Melvyn Novas, M D  Primary Care Physician:  Dorothyann Peng, MD   Referring Provider: Dorothyann Peng, MD    Chief Complaint  Patient presents with  . New Patient (Initial Visit)    pt is spanish speaking and with husband and interpreter, rm 11. pt has been having headaches and dizziness. pt husband states yes to snoring in sleep.    HPI:  Christy Graham is a 37 y.o. female , seen here with translator  in a referral  from Dr. Allyne Gee for a sleep evaluation.  The patient had seen Dr. Allyne Gee in 2018 after suffering for several months of headaches.  The headaches improved after she was given the vitamins, possibly biotin but the patient reports vitamin D was supplemented.  She felt better but then her headaches returned early this year, in early January.  She restarted vitamin D supplementation but did not find the same relief.  The vitamin D helped her headaches and dizziness.  She did no longer require taking medications for dizziness. Her headaches are frontal bifrontal, sometimes they feel like pressure is applied to the back of the eye.  She does not get nauseated with headaches, there is no visual aura reported scotoma , lights or distorted vision.  Dr. Lelon Perla referred for sleep study as she suspected that the patient has risk factors for obstructive sleep apnea which may be contributing to headaches and even dizziness.  In Dr. Lelon Perla office and orthostatic blood pressure was obtained on 11 June 2017.  The patient was lying down with a right arm blood pressure of 112/74, sitting 96/70, standing blood pressure 100/76 mmHg, they also repeated the sitting blood pressure on the left arm and her blood pressure they are was 110/68.  Listed prescription medications were vitamin D 50,000 units twice a week.  The patient was also evaluated for iron saturation which was very low at 11%, total iron binding capacity was 395 mcg, vitamin D was 19.4 ng  which is low, ferritin was not obtained, thyroid hormones were normal limits, diabetes study was normal, cholesterol was 181 mg deciliter, triglycerides were elevated at 253.  The patient is not anemic and had a normal number of red blood cells 4.4/mcL.  Panel was in normal limits  Chief complaint according to patient : headaches. She was unaware of a sleep evaluation.   Sleep habits are as follows: she goes to bed between 9 and 10 pm. She falls sleep quickly. She sleeps in her sides. 2 pillows.  No GERD. Snoring, but no apnea reported through husband. Nocturia only one times.  Rises at 6 AM, relies on an alarm to be set. Frequently wakes up with headaches being present, but headaches do not wake her from sleep. Patient endorsed 6-7 hours of nocturnal sleep time.  Sleep medical history and family sleep history: No history of somnambulism, enuresis, night terrors.  She is unaware of any family members having a diagnosis of sleep disorder or sleep apnea.   Social history: married, lives with husband, 4 children.  The patient is a non-smoker, she would be a social drinker, not a daily user of alcohol. Caffeine-one cup of coffee in the morning, not a frequent drinker of sodas, ice tea.  Review of Systems: Out of a complete 14 system review, the patient complains of only the following symptoms, and all other reviewed systems are negative.  Epworth score 13/24   , Fatigue severity score 31  , depression score  n/a    Social History   Socioeconomic History  . Marital status: Married    Spouse name: Not on file  . Number of children: Not on file  . Years of education: Not on file  . Highest education level: Not on file  Social Needs  . Financial resource strain: Not on file  . Food insecurity - worry: Not on file  . Food insecurity - inability: Not on file  . Transportation needs - medical: Not on file  . Transportation needs - non-medical: Not on file  Occupational History  . Not on file   Tobacco Use  . Smoking status: Never Smoker  . Smokeless tobacco: Never Used  Substance and Sexual Activity  . Alcohol use: Yes    Alcohol/week: 0.0 oz    Comment: RARE  . Drug use: No  . Sexual activity: Yes    Comment: TUBAL LIGATION   Other Topics Concern  . Not on file  Social History Narrative  . Not on file    Family History  Problem Relation Age of Onset  . Diabetes Mother     No past medical history on file.  Past Surgical History:  Procedure Laterality Date  . CESAREAN SECTION  05/12/2011   Procedure: CESAREAN SECTION;  Surgeon: Kathreen Cosier, MD;  Location: [facility];  Service: Gynecology;  Laterality: N/A;  . TUBAL LIGATION      Current Outpatient Medications  Medication Sig Dispense Refill  . Cholecalciferol (VITAMIN D3) 50000 units CAPS Take 1 capsule by mouth 2 (two) times a week.     No current facility-administered medications for this visit.     Allergies as of 07/17/2017  . (No Known Allergies)    Vitals: BP 123/82   Pulse 85   Ht 5\' 1"  (1.549 m)   Wt 171 lb (77.6 kg)   BMI 32.31 kg/m  Last Weight:  Wt Readings from Last 1 Encounters:  07/17/17 171 lb (77.6 kg)   ZOX:WRUE mass index is 32.31 kg/m.     Last Height:   Ht Readings from Last 1 Encounters:  07/17/17 5\' 1"  (1.549 m)    Physical exam:  General: The patient is awake, alert and appears not in acute distress. The patient is well groomed. Head: Normocephalic, atraumatic. Neck is supple. Mallampati 15,  neck circumference: 3-4 .  Nasal airflow patent ,  Retrognathia is not seen.  Cardiovascular:  Regular rate and rhythm , without  murmurs or carotid bruit, and without distended neck veins. Respiratory: Lungs are clear to auscultation. Skin:  Without evidence of edema, or rash Trunk: BMI is 32.   Neurologic exam : The patient is awake and alert, oriented to place and time.   Attention span & concentration ability appears normal.  Speech is fluent in spanish, without   dysarthria, dysphonia or aphasia.  Mood and affect are appropriate.  Cranial nerves: Pupils are equal and briskly reactive to light. Funduscopic exam deferred. Extraocular movements in vertical and horizontal planes intact and without nystagmus. Visual fields by finger perimetry are intact. Hearing to finger rub intact. Facial sensation intact to fine touch. Facial motor strength is symmetric and tongue and uvula move midline. Shoulder shrug was symmetrical.   Motor exam:  Normal tone, muscle bulk and symmetric strength in all extremities. Sensory:  Fine touch, pinprick and vibration were tested in all extremities. Proprioception tested in the upper extremities was normal. Coordination: Finger-to-nose maneuver  normal without evidence of ataxia, dysmetria or tremor. Gait and  station: Patient walks without assistive device. Deep tendon reflexes: in the  upper and lower extremities are symmetric and intact.   Assessment:  After physical and neurologic examination, review of laboratory studies,  Personal review of imaging studies, reports of other /same  Imaging studies, results of polysomnography and / or neurophysiology testing and pre-existing records as far as provided in visit., my assessment is   1) Mrs. Montez-Moreno reports waking up with headaches but is not woken by headaches.  Her husband has noted that she snores and she did endorse a higher degree of sleepiness in daytime than average.  I would like to evaluate her for obstructive sleep apnea, and will order a home sleep test for the patient.  She does not complain about insomnia so I am able to assume that she will sleep most of the time she would be using the home sleep test device.  Home sleep test device is a screening test for the presence of obstructive sleep apnea. If there is a high degree of snoring indicated or oxygen desaturations of concern I would definitely discuss with the patient to start her on a CPAP machine.  If she  does not have apnea or a very mild form of apnea we may chose a dental device.   In addition, hypoxemia and hypercapnia risk factors for morning headaches, the type that the patient describes which does not prefers the left or right side of the temple.   The patient was advised of the nature of the diagnosed disorder , the treatment options and the  risks for general health and wellness arising from not treating the condition.   I spent more than 45 minutes of face to face time with the patient.  Greater than 50% of time was spent in counseling and coordination of care. We have discussed the diagnosis and differential and I answered the patient's questions.    Plan:  Treatment plan and additional workup : HST -  If unclear results, will repeat or invite for attended sleep study;  RV with Me or NP after HST.     Melvyn NovasARMEN Oriah Leinweber, MD 07/17/2017, 3:27 PM  Certified in Neurology by ABPN Certified in Sleep Medicine by [facility]  [facility] [address], [address] [place], [place] [id number]

## 2017-07-17 NOTE — Addendum Note (Signed)
Addended by: Melvyn NovasHMEIER, Makylee Sanborn on: 07/17/2017 03:51 PM   Modules accepted: Orders

## 2017-10-15 ENCOUNTER — Encounter: Payer: Commercial Managed Care - PPO | Admitting: Obstetrics & Gynecology

## 2017-10-23 ENCOUNTER — Ambulatory Visit: Payer: Commercial Managed Care - PPO | Admitting: Obstetrics & Gynecology

## 2017-10-23 ENCOUNTER — Encounter: Payer: Self-pay | Admitting: Obstetrics & Gynecology

## 2017-10-23 VITALS — Ht 60.5 in | Wt 172.0 lb

## 2017-10-23 DIAGNOSIS — Z113 Encounter for screening for infections with a predominantly sexual mode of transmission: Secondary | ICD-10-CM

## 2017-10-23 DIAGNOSIS — R300 Dysuria: Secondary | ICD-10-CM

## 2017-10-23 DIAGNOSIS — Z9851 Tubal ligation status: Secondary | ICD-10-CM | POA: Diagnosis not present

## 2017-10-23 DIAGNOSIS — Z1151 Encounter for screening for human papillomavirus (HPV): Secondary | ICD-10-CM | POA: Diagnosis not present

## 2017-10-23 DIAGNOSIS — Z01419 Encounter for gynecological examination (general) (routine) without abnormal findings: Secondary | ICD-10-CM

## 2017-10-23 MED ORDER — SULFAMETHOXAZOLE-TRIMETHOPRIM 800-160 MG PO TABS: 1.0000 | ORAL_TABLET | Freq: Two times a day (BID) | ORAL | 0 refills | Status: AC

## 2017-10-23 NOTE — Addendum Note (Signed)
Addended by: Berna Spare A on: 10/23/2017 09:32 AM   Modules accepted: Orders

## 2017-10-23 NOTE — Progress Notes (Signed)
Christy Graham 1980-09-20 [id number]   History:    37 y.o. G5P4A1L4 Married x 16 years. S/P TL.  Children from 71 to 51 yo, doing well.  RP:  Established patient presenting for annual gyn exam   HPI: Status post tubal ligation.  Menses regular every month with heavier flow the first 2 days.  Last hemoglobin in the chart was normal at 12.3 in 2017 but per patient's hemoglobin was mildly low with family physician this year.  Patient taking vitamins with iron.  Good nutrition with green vegetables and red meat.  No pelvic pain.  No pain with intercourse.  History of abnormal Pap about 10 years ago.  Had a colposcopy with biopsies but no treatment necessary per patient.  Would like STD screening.  Normal vaginal secretions.  Complaints of burning with micturition and frequency worse in the last week.  No blood in urine.  No fever.  Last bladder infection was a year ago or so.  Bowel movements normal.  Breasts normal.  Health labs with family physician.  Past medical history,surgical history, family history and social history were all reviewed and documented in the EPIC chart.  Gynecologic History Patient's last menstrual period was 10/14/2017. Contraception: tubal ligation Last Pap: 10/2016. Results were: Negative Last mammogram: Never Bone Density: Never Colonoscopy: Never  Obstetric History OB History  Gravida Para Term Preterm AB Living  SAB TAB Ectopic Multiple Live Births  1       4    # Outcome Date GA Lbr Len/2nd Weight Sex Delivery Anes PTL Lv  5 Term 05/12/11 [redacted]w[redacted]d  5 lb 9.8 oz (2.545 kg) F CS-LTranv Spinal  LIV  4 SAB 07/06/05 [redacted]w[redacted]d   F  None  DEC  3 Term 02/19/03 [redacted]w[redacted]d   M Vag-Spont None N LIV  2 Term 03/17/99 [redacted]w[redacted]d   F Vag-Spont None N LIV  1 Term 02/18/86 [redacted]w[redacted]d   F Vag-Spont None N LIV     ROS: A ROS was performed and pertinent positives and negatives are included in the history.  GENERAL: No fevers or chills. HEENT: No change in vision, no earache,  sore throat or sinus congestion. NECK: No pain or stiffness. CARDIOVASCULAR: No chest pain or pressure. No palpitations. PULMONARY: No shortness of breath, cough or wheeze. GASTROINTESTINAL: No abdominal pain, nausea, vomiting or diarrhea, melena or bright red blood per rectum. GENITOURINARY: No urinary frequency, urgency, hesitancy or dysuria. MUSCULOSKELETAL: No joint or muscle pain, no back pain, no recent trauma. DERMATOLOGIC: No rash, no itching, no lesions. ENDOCRINE: No polyuria, polydipsia, no heat or cold intolerance. No recent change in weight. HEMATOLOGICAL: No anemia or easy bruising or bleeding. NEUROLOGIC: No headache, seizures, numbness, tingling or weakness. PSYCHIATRIC: No depression, no loss of interest in normal activity or change in sleep pattern.     Exam:   Ht 5' 0.5" (1.537 m)   Wt 172 lb (78 kg)   LMP 10/14/2017 Comment: tubal ligation   BMI 33.04 kg/m   Body mass index is 33.04 kg/m.  General appearance : Well developed well nourished female. No acute distress HEENT: Eyes: no retinal hemorrhage or exudates,  Neck supple, trachea midline, no carotid bruits, no thyroidmegaly Lungs: Clear to auscultation, no rhonchi or wheezes, or rib retractions  Heart: Regular rate and rhythm, no murmurs or gallops Breast:Examined in sitting and supine position were symmetrical in appearance, no palpable masses or tenderness,  no skin retraction, no nipple inversion, no  nipple discharge, no skin discoloration, no axillary or supraclavicular lymphadenopathy Abdomen: no palpable masses or tenderness, no rebound or guarding Extremities: no edema or skin discoloration or tenderness  Pelvic: Vulva: Normal             Vagina: No gross lesions or discharge  Cervix: No gross lesions or discharge.  Pap/HPV HR, Gono-Chlam done  Uterus  AV, normal size, shape and consistency, non-tender and mobile  Adnexa  Without masses or tenderness  Anus: Normal  U/A: Dark yellow, cloudy, nitrites  negative, white blood cells 0-5, red blood cells 0-2, bacteria moderate.  Urine culture pending.   Assessment/Plan:  37 y.o. female for annual exam   1. Encounter for routine gynecological examination with Papanicolaou smear of cervix Normal gynecologic exam.  Menses regular and stable with possibly heavier flow the first 2 days.  Recommend ibuprofen regularly the first 2 days of her cycles to decrease the flow.  Iron rich nutrition and iron supplement recommended.  Pap with high-risk HPV done today.  Breast exam normal.  Health labs with family physician.  Patient encouraged to exercise more with aerobic activities 5 times a week and weightlifting every 2 days.  Patient has a good nutrition.  2. S/P tubal ligation  3. Dysuria Probable cystitis per symptoms and urine analysis.  Decision to start on Bactrim 1 tablet per mouth twice a day for 3 days.  Pending urine culture.  Patient has no allergy to antibiotics.  Usage of medication reviewed. - Urinalysis,Complete w/RFL Culture  4. Screen for STD (sexually transmitted disease) Recommend condom use. -Gonorrhea and chlamydia on Pap - HIV antibody (with reflex) - RPR - Hepatitis C Antibody - Hepatitis B Surface AntiGEN  Other orders - sulfamethoxazole-trimethoprim (BACTRIM DS,SEPTRA DS) 800-160 MG tablet; Take 1 tablet by mouth 2 (two) times daily for 3 days.  Counseling and coordination of care more than 50% for 10 minutes.  Genia Del MD, 8:17 AM 10/23/2017

## 2017-10-23 NOTE — Patient Instructions (Signed)
1. Encounter for routine gynecological examination with Papanicolaou smear of cervix Normal gynecologic exam.  Menses regular and stable with possibly heavier flow the first 2 days.  Recommend ibuprofen regularly the first 2 days of her cycles to decrease the flow.  Iron rich nutrition and iron supplement recommended.  Pap with high-risk HPV done today.  Breast exam normal.  Health labs with family physician.  Patient encouraged to exercise more with aerobic activities 5 times a week and weightlifting every 2 days.  Patient has a good nutrition.  2. S/P tubal ligation  3. Dysuria Probable cystitis per symptoms and urine analysis.  Decision to start on Bactrim 1 tablet per mouth twice a day for 3 days.  Pending urine culture.  Patient has no allergy to antibiotics.  Usage of medication reviewed. - Urinalysis,Complete w/RFL Culture  4. Screen for STD (sexually transmitted disease) Recommend condom use. -Gonorrhea and chlamydia on Pap - HIV antibody (with reflex) - RPR - Hepatitis C Antibody - Hepatitis B Surface AntiGEN  Other orders - sulfamethoxazole-trimethoprim (BACTRIM DS,SEPTRA DS) 800-160 MG tablet; Take 1 tablet by mouth 2 (two) times daily for 3 days.  Sanjuana Kava un placer encontrarle hoy!  Voy a informarle de sus [employer]!   Dieta con alto contenido de hierro (Iron-Rich Diet) El hierro es un mineral que ayuda al organismo a producir hemoglobina. La hemoglobina es una protena de los glbulos rojos que transporta el oxgeno a los tejidos del cuerpo. Consumir muy poco hierro [employer] se sienta dbil y [place], y aumentar su riesgo de contraer infecciones. Consumir la cantidad suficiente de hierro es necesario para el metabolismo del cuerpo, el funcionamiento muscular y el [place]. El hierro es un componente natural de muchos alimentos. Tambin puede agregarse a los alimentos, o bien los alimentos pueden fortificarse con hierro. Hay dos tipos de hierro de  los alimentos:  Hierro hemo. El organismo absorbe el hierro hemo con mayor facilidad que el no hemo. La carne de res, de ave y de pescado contienen hierro hemo.  Hierro no hemo. Se encuentra en los complementos alimenticios, los cereales fortificados con hierro, los frijoles y las verduras. Es posible que deba seguir una dieta con alto contenido de hierro en los siguientes casos:  Si le han diagnosticado una deficiencia de hierro o anemia por deficiencia de hierro.  Si tiene una enfermedad que le impide absorber el hierro de los alimentos, por ejemplo: ? Infecciones intestinales. ? Enfermedad celaca. Esto incluye la inflamacin permanente (crnica) de los intestinos.  No consume suficiente hierro.  Consume una dieta que incluye muchos alimentos que afectan la absorcin de hierro.  Ha perdido [employer].  Tiene sangrado abundante cuando [place].  Est embarazada. EN QU CONSISTE EL PLAN? El mdico puede ayudarlo a Production assistant, radio cantidad de hierro que necesita a diario en funcin de su cuadro clnico. Por lo general, Adelene Amas persona consume cantidades suficientes de hierro en la dieta, se satisfacen las siguientes necesidades de hierro:  Hombres. ? De 14 a 18aos:  al da. ? De 19 a 50aos:  al da.  Mujeres. ? De 14 a 18aos:  al da. ? De 19 a 50aos:  al da. ? Mayores de 50aos:  al da. ? Embarazadas:  al da. ? Mujeres en perodo de lactancia:  al da. QU DEBO SABER ACERCA DE LA DIETA CON ALTO CONTENIDO DE HIERRO?  Consuma frutas y verduras frescas con alto contenido de vitaminaC junto con alimentos ricos en hierro. Esto  ayudar a aumentar la cantidad de hierro que el cuerpo absorbe de los alimentos, en especial, con los que contienen hierro no hemo. Entre los alimentos con alto contenido de vitaminaC se incluyen las [place], los pimientos morrones, los tomates y Social worker.  Tome los suplementos de hierro solamente como se lo  haya indicado el mdico. La sobredosis de hierro puede ser potencialmente mortal. Si le recetan suplementos de hierro, tmelos con jugo de naranja o un suplemento de vitaminaC.  Cocine los alimentos en ollas de hierro.  Consuma alimentos que contengan hierro no hemo junto con aquellos con alto contenido de hierro hemo. Esto ayuda a mejorar la absorcin de hierro.  Determinados alimentos y ciertas bebidas contienen compuestos que afectan la absorcin de hierro. No consuma estos alimentos en la misma comida que aquellos con alto contenido de hierro o con suplementos de Company secretary. Estos incluyen los siguientes: ? Caf, t negro y vino tinto. ? Leche, productos lcteos y alimentos con alto contenido de calcio. ? Frijoles, porotos de soja y [place]. ? Cereales integrales.  Cuando consuma alimentos que contengan hierro no hemo y compuestos que afecten la absorcin de hierro, siga estos consejos para mejorar la absorcin de hierro. ? Antes de cocinarlos, remoje los frijoles durante la noche. ? Antes de usarlos, remoje los cereales integrales durante la noche y culelos. ? Prepare un fermento con las harinas antes del horneado, como si usara levadura en la masa del pan. QU ALIMENTOS PUEDO COMER? Cereales Cereales para el desayuno fortificados con hierro. Pan de trigo integral fortificado con hierro. Arroz enriquecido. Granos germinados. Verduras [place]. Papas con cscara. Guisantes. Brcoli. Pimientos morrones rojos y verdes. Verduras fermentadas. Frutas Ciruelas pasas. Pasas. Naranjas. Jinny Sanders. Mango. Pomelo. Carnes y otras fuentes de protenas Hgado de res. Ostras. Carne de vaca. Camarones. Pavo. Pollo. Atn. Sardinas. Garbanzos. Nueces. Tofu. Bebidas Jugo de tomate. Jugo de naranja recin exprimido. Jugo de ciruelas. T de hibisco. Batidos instantneos fortificados para el desayuno. Condimentos Tahini. Salsa de soja fermentada. Dulces y Statistician. Otros Germen de trigo. Los  artculos mencionados arriba pueden no ser [employer] de las bebidas o los alimentos recomendados. Comunquese con el nutricionista para conocer ms opciones. QU ALIMENTOS NO SE RECOMIENDAN? Cereales Cereales integrales. Cereal de salvado. Harina de salvado. Avena. Verduras [place]. Repollitos de Bruselas. Col rizada. Nils Pyle Arndanos. Blanchie Serve. Jinny Sanders. Higos. Carnes y otras fuentes de protenas Soja. Productos elaborados a base de protena de la soja. Lcteos Leche. Crema. Queso. Yogur. [employer]. Bebidas Caf. T negro. Vino tinto. Dulces y [employer]. Chocolate. Helados. Otros [place]. Organo. Perejil. Los artculos mencionados arriba pueden no ser [employer] de las bebidas y los alimentos que se Theatre stage manager. Comunquese con el nutricionista para obtener ms informacin. Esta informacin no tiene Theme park manager el consejo del mdico. Asegrese de hacerle al mdico cualquier pregunta que tenga. Document Released: 03/08/2006 Document Revised: 06/12/2014 Document Reviewed: 12/17/2013 Elsevier Interactive Patient Education  [employer].

## 2017-10-24 LAB — HEPATITIS C ANTIBODY
HEP C AB: NONREACTIVE
SIGNAL TO CUT-OFF: 0.01 (ref <1.00)

## 2017-10-24 LAB — RPR: RPR: NONREACTIVE

## 2017-10-24 LAB — HIV ANTIBODY (ROUTINE TESTING W REFLEX): HIV: NONREACTIVE

## 2017-10-24 LAB — HEPATITIS B SURFACE ANTIGEN: HEP B S AG: NONREACTIVE

## 2017-10-25 LAB — URINALYSIS, COMPLETE W/RFL CULTURE
BILIRUBIN URINE: NEGATIVE
GLUCOSE, UA: NEGATIVE
Hyaline Cast: NONE SEEN /LPF
Ketones, ur: NEGATIVE
LEUKOCYTE ESTERASE: NEGATIVE
NITRITES URINE, INITIAL: NEGATIVE
Protein, ur: NEGATIVE
Specific Gravity, Urine: 1.025 (ref 1.001–1.03)
pH: 5.5 (ref 5.0–8.0)

## 2017-10-25 LAB — URINE CULTURE
MICRO NUMBER:: 90621636
SPECIMEN QUALITY: ADEQUATE

## 2017-10-25 LAB — PAP IG, CT-NG NAA, HPV HIGH-RISK
C. TRACHOMATIS RNA, TMA: NOT DETECTED
HPV DNA High Risk: NOT DETECTED
N. gonorrhoeae RNA, TMA: NOT DETECTED

## 2017-10-25 LAB — CULTURE INDICATED

## 2018-06-08 ENCOUNTER — Emergency Department (HOSPITAL_COMMUNITY)
Admission: EM | Admit: 2018-06-08 | Discharge: 2018-06-09 | Disposition: A | Discharge status: Home / Self Care | Payer: Commercial Managed Care - PPO | Attending: Emergency Medicine | Admitting: Emergency Medicine

## 2018-06-08 ENCOUNTER — Encounter (HOSPITAL_COMMUNITY): Payer: Self-pay

## 2018-06-08 DIAGNOSIS — R202 Paresthesia of skin: Secondary | ICD-10-CM | POA: Insufficient documentation

## 2018-06-08 DIAGNOSIS — M545 Low back pain, unspecified: Secondary | ICD-10-CM

## 2018-06-08 NOTE — ED Triage Notes (Signed)
Onset yesterday pt woke up with mid back pain, numbness on right anterior knee.  No known injury.  Taking ibuprofen with no relief.

## 2018-06-09 MED ORDER — OXYCODONE-ACETAMINOPHEN 5-325 MG PO TABS
1.0000 | ORAL_TABLET | Freq: Once | ORAL | Status: AC
  Administered 2018-06-09: 1 via ORAL
  Filled 2018-06-09: qty 1

## 2018-06-09 MED ORDER — METHOCARBAMOL 750 MG PO TABS: 750.0000 mg | ORAL_TABLET | Freq: Three times a day (TID) | ORAL | 0 refills | Status: DC | PRN

## 2018-06-09 NOTE — ED Notes (Signed)
Pt reports lower back pain for 2 days now. Pt denies any recent injury, pain during urination, or trouble urinating.

## 2018-06-09 NOTE — ED Notes (Signed)
Patient verbalizes understanding of discharge instructions. Opportunity for questioning and answers were provided. Armband removed by staff, pt discharged from ED home via POV with family. 

## 2018-06-09 NOTE — ED Provider Notes (Signed)
[facility] EMERGENCY DEPARTMENT Provider Note   CSN: [id number] Arrival date & time: 06/08/18  2033     History   Chief Complaint Chief Complaint  Patient presents with  . Back Pain    HPI Christy Graham is a 38 y.o. female with a past medical history of bilateral tubal ligation presents today for evaluation of lower back pain since yesterday morning.  She reports that she woke up with pain in the right side of her lower back, tingling feeling on the right leg medially from the knee down and tingling in the bilateral hands.  She denies any true numbness or pain with this.  She has been trying ibuprofen and Tylenol at home without relief.  She denies any fevers.  No abdominal pain.  No known injury.  She denies any personal history of cancer, no recent unintended weight loss.  No personal history of IV drug use.  History obtained from patient and husband through Spanish speaking interpreter.  HPI  History reviewed. No pertinent past medical history.  There are no active problems to display for this patient.   Past Surgical History:  Procedure Laterality Date  . CESAREAN SECTION  05/12/2011   Procedure: CESAREAN SECTION;  Surgeon: Kathreen Cosier, MD;  Location: [facility];  Service: Gynecology;  Laterality: N/A;  . TUBAL LIGATION       OB History    Gravida  5   Para  4   Term  4   Preterm      AB  1   Living  4     SAB  1   TAB      Ectopic      Multiple      Live Births  4            Home Medications    Prior to Admission medications   Medication Sig Start Date End Date Taking? Authorizing Provider  methocarbamol (ROBAXIN) 750 MG tablet Take 1-2 tablets (750-1,500 mg total) by mouth 3 (three) times daily as needed for muscle spasms. 06/09/18   Cristina Gong, PA-C    Family History Family History  Problem Relation Age of Onset  . Diabetes Mother     Social History Social History   Tobacco Use  . Smoking  status: Never Smoker  . Smokeless tobacco: Never Used  Substance Use Topics  . Alcohol use: Yes    Alcohol/week: 0.0 standard drinks    Comment: RARE  . Drug use: No     Allergies   Patient has no known allergies.   Review of Systems Review of Systems  Constitutional: Negative for chills and fever.  Musculoskeletal: Positive for back pain. Negative for neck pain.  Neurological: Negative for dizziness, light-headedness, numbness and headaches.       Tingling  All other systems reviewed and are negative.    Physical Exam Updated Vital Signs BP 126/89 (BP Location: Left Arm)   Pulse 72   Temp 97.7 F (36.5 C) (Oral)   Resp 18   LMP 05/29/2018   SpO2 96%   Physical Exam Vitals signs and nursing note reviewed.  Constitutional:      General: She is not in acute distress.    Appearance: She is not ill-appearing.  HENT:     Head: Normocephalic.  Eyes:     Conjunctiva/sclera: Conjunctivae normal.  Neck:     Musculoskeletal: Normal range of motion and neck supple. No muscular tenderness.  Cardiovascular:  Rate and Rhythm: Normal rate.     Pulses: Normal pulses.     Heart sounds: Normal heart sounds.     Comments: 2+ radial, DP/PT pulses bilaterally. Pulmonary:     Effort: Pulmonary effort is normal. No respiratory distress.     Breath sounds: Normal breath sounds.  Abdominal:     General: Abdomen is flat. Bowel sounds are normal.     Tenderness: There is no abdominal tenderness.  Musculoskeletal:     Comments: C/T/L-spine palpated without midline tenderness to palpation, step-offs or deformities.  There is right-sided lumbar paraspinal muscle tenderness to palpation.  Palpation here both re-creates and exacerbates her reported back pain and worsens her tingling in her right leg.   Skin:    General: Skin is warm and dry.  Neurological:     Mental Status: She is alert.     Comments: Speech is not slurred, able to have normal conversation, awake and alert.  Face is  symmetrical with out droop.  Subjective "different" sensation over right medial leg from knee to ankle, not extending above knee or down into ankle.  Subjective different sensation over the palmar and dorsal aspect of bilateral hands, equally abnormal on all fingers.  Abnormal sensation stops at the wrist bilaterally.  5/5 strength in bilateral upper and lower extremities.   Psychiatric:        Mood and Affect: Mood normal.      ED Treatments / Results  Labs (all labs ordered are listed, but only abnormal results are displayed) Labs Reviewed - No data to display  EKG None  Radiology No results found.  Procedures Procedures (including critical care time)  Medications Ordered in ED Medications  oxyCODONE-acetaminophen (PERCOCET/ROXICET) 5-325 MG per tablet 1 tablet (has no administration in time range)     Initial Impression / Assessment and Plan / ED Course  I have reviewed the triage vital signs and the nursing notes.  Pertinent labs & imaging results that were available during my care of the patient were reviewed by me and considered in my medical decision making (see chart for details).    Patient with back pain. Patient can walk but states is painful.  No loss of bowel or bladder control.  No concern for cauda equina.  No fever, night sweats, weight loss, h/o cancer, IVDU.  She does have reportedly subjective decreased sensation/tingling over the right medial lower leg from knee to ankle along with in her bilateral hands equally with all fingers and on the dorsal/palmar aspects.  Her strength is intact and she has no true numbness.  She has no neck pain that would be suspicious for cord impingement.  RICE protocol and pain medicine indicated and discussed with patient.  Discussed safe dosing of Tylenol and ibuprofen.  She is given Percocet in the department to help control her pain and prescription for muscle relaxers at home.  Return precautions were discussed with patient who  states their understanding.  At the time of discharge patient denied any unaddressed complaints or concerns.  Patient is agreeable for discharge home.   Final Clinical Impressions(s) / ED Diagnoses   Final diagnoses:  Acute right-sided low back pain, unspecified whether sciatica present  Paresthesia of both hands  Tingling sensation    ED Discharge Orders         Ordered    methocarbamol (ROBAXIN) 750 MG tablet  3 times daily PRN     06/09/18 0646  Cristina GongHammond, Tamarra Geiselman W, PA-C 06/09/18 [id number]    Raeford RazorKohut, Stephen, MD 06/12/18 [id number]

## 2018-06-09 NOTE — Discharge Instructions (Signed)

## 2018-06-11 ENCOUNTER — Encounter: Payer: Self-pay | Admitting: Internal Medicine

## 2018-06-11 ENCOUNTER — Ambulatory Visit
Admission: RE | Admit: 2018-06-11 | Discharge: 2018-06-11 | Disposition: A | Discharge status: Home / Self Care | Payer: Commercial Managed Care - PPO | Source: Ambulatory Visit | Attending: Internal Medicine | Admitting: Internal Medicine

## 2018-06-11 ENCOUNTER — Ambulatory Visit: Payer: Commercial Managed Care - PPO | Admitting: Internal Medicine

## 2018-06-11 VITALS — BP 120/72 | HR 96 | Temp 98.5°F | Ht 60.8 in | Wt 172.8 lb

## 2018-06-11 DIAGNOSIS — R202 Paresthesia of skin: Secondary | ICD-10-CM

## 2018-06-11 DIAGNOSIS — M5440 Lumbago with sciatica, unspecified side: Secondary | ICD-10-CM

## 2018-06-11 DIAGNOSIS — M545 Low back pain: Secondary | ICD-10-CM | POA: Diagnosis not present

## 2018-06-11 MED ORDER — IBUPROFEN 800 MG PO TABS: 800.0000 mg | ORAL_TABLET | Freq: Three times a day (TID) | ORAL | 0 refills | Status: DC
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
## 2018-06-11 NOTE — Patient Instructions (Signed)
Dolor de espalda agudo en los adultos  Acute Back Pain, Adult  El dolor de espalda agudo es repentino y por lo general no dura mucho tiempo. Se debe generalmente a una lesin de los msculos y tejidos de la espalda. La lesin puede ser el resultado de:   Estiramiento en exceso o desgarro (esguince) de un msculo o ligamento. Los ligamentos son tejidos que conectan los huesos. Levantar algo de forma incorrecta puede producir un esguince de espalda.   Desgaste (degeneracin) de los discos vertebrales. Los discos vertebrales son tejido circular que proporciona acolchonamiento a los huesos de la columna vertebral (vrtebras).   Movimientos de giro, como al practicar deportes o realizar trabajos de jardinera.   Un golpe en la espalda.   Artritis.  Es posible que le realicen un examen fsico, anlisis de laboratorio u otros estudios de diagnstico por imgenes para encontrar la causa del dolor. El dolor de espalda agudo generalmente desaparece con reposo y cuidados en la casa.  Sigue estas indicaciones en tu casa:  Control del dolor, el entumecimiento y la hinchazn   Toma los medicamentos de venta libre y los recetados solamente como se lo haya indicado el mdico.   El mdico puede recomendarle que se aplique hielo durante las primeras 24a 48horas despus del comienzo del dolor. Para hacer esto:  ? Ponga el hielo en una bolsa plstica.  ? Coloque una toalla entre la piel y la bolsa.  ? Coloque el hielo durante 20minutos, 2 a 3veces por da.   Si se lo indican, aplique calor en la zona afectada con la frecuencia que le haya indicado el mdico. Use la fuente de calor que el mdico le recomiende, como una compresa de calor hmedo o una almohadilla trmica.  ? Colquese una toalla entre la piel y la fuente de calor.  ? Aplique calor durante 20 a 30minutos.  ? Retire la fuente de calor si la piel se pone de color rojo brillante. Esto es especialmente importante si no puede sentir dolor, calor o fro. Corre un  mayor riesgo de sufrir quemaduras.  Actividad     No permanezca en la cama. Hacer reposo en la cama por ms de 1 a 2 das puede demorar su recuperacin.   Mantenga una buena postura al sentarse y pararse. No se incline hacia adelante al sentarse ni se encorve al pararse.  ? Si trabaja en un escritorio, sintese cerca de este para no tener que inclinarse. Mantenga el mentn hacia abajo. Mantenga el cuello hacia atrs y los codos flexionados en ngulo recto. La posicin de los brazos debe verse como la letra "L".  ? Cuando conduzca, sintese elevado y cerca del volante. Agregue un apoyo para la espalda (lumbar) al asiento del automvil, si es necesario.   Realice caminatas cortas en superficies planas tan pronto como le sea posible. Trate de caminar un poco ms de tiempo cada da.   No se siente, conduzca o permanezca de pie en un mismo lugar durante ms de 30 minutos seguidos. Pararse o sentarse durante largos perodos de tiempo puede sobrecargar la espalda.   No conduzca ni use maquinaria pesada mientras toma analgsicos recetados.   Use tcnicas apropiadas para levantar objetos. Cuando se inclina y levanta un objeto, utilice posiciones que no sobrecarguen tanto la espalda:  ? Flexione las rodillas.  ? Mantenga la carga cerca del cuerpo.  ? No gire el cuerpo.   Haga actividad fsica habitualmente como se lo haya indicado el mdico. Hacer ejercicios ayuda   a que la espalda sane ms rpido y ayuda a evitar las lesiones de la espalda al mantener los msculos fuertes y flexibles.   Trabaje con un fisioterapeuta para crear un programa de ejercicios seguros, segn lo recomiende el mdico. Haga ejercicios como se lo haya indicado el fisioterapeuta.  Estilo de vida   Mantenga un peso saludable. El sobrepeso sobrecarga la espalda y hace que resulte difcil tener una buena postura.   Evite actividades o situaciones que lo hagan sentirse ansioso o estresado. El estrs y la ansiedad aumentan la tensin muscular y  pueden empeorar el dolor de espalda. Aprenda formas de manejar la ansiedad y el estrs, como a travs del ejercicio.  Indicaciones generales   Duerma sobre un colchn firme en una posicin cmoda. Intente acostarse de costado, con las rodillas ligeramente flexionadas. Si se recuesta sobre la espalda, coloque una almohada debajo de las rodillas.   Siga el plan de tratamiento como se lo haya indicado el mdico. Puede incluir:  ? Terapia cognitiva o conductual.  ? Acupuntura o terapia de masajes.  ? Yoga o meditacin.  Comuncate con un mdico si:   Siente un dolor que no se alivia con reposo o medicamentos.   Siente mucho dolor que se extiende a las piernas o las nalgas.   El dolor no mejora luego de 2 semanas.   Siente dolor por la noche.   Pierde peso sin proponrselo.   Tiene fiebre o escalofros.  Solicite ayuda inmediatamente si:   Tiene nuevos problemas para controlar la vejiga o los intestinos.   Siente debilidad o adormecimiento inusuales en los brazos o en las piernas.   Siente nuseas o vmitos.   Siente dolor abdominal.   Siente que va a desmayarse.  Resumen   El dolor de espalda agudo es repentino y por lo general no dura mucho tiempo.   Use tcnicas apropiadas para levantar objetos. Cuando se inclina y levanta un objeto, utilice posiciones que no sobrecarguen tanto la espalda.   Tome los medicamentos de venta libre o recetados y aplique calor o hielo solamente como se lo haya indicado el mdico.  Esta informacin no tiene como fin reemplazar el consejo del mdico. Asegrese de hacerle al mdico cualquier pregunta que tenga.  Document Released: 05/22/2005 Document Revised: 12/28/2017 Document Reviewed: 03/08/2017  Elsevier Interactive Patient Education  2019 Elsevier Inc.

## 2018-06-12 ENCOUNTER — Other Ambulatory Visit: Payer: Self-pay | Admitting: Internal Medicine

## 2018-06-12 DIAGNOSIS — R945 Abnormal results of liver function studies: Secondary | ICD-10-CM

## 2018-06-12 DIAGNOSIS — D508 Other iron deficiency anemias: Secondary | ICD-10-CM

## 2018-06-12 DIAGNOSIS — R7309 Other abnormal glucose: Secondary | ICD-10-CM

## 2018-06-12 LAB — CBC
Hematocrit: 36.6 % (ref 34.0–46.6)
Hemoglobin: 12.1 g/dL (ref 11.1–15.9)
MCH: 25.9 pg — AB (ref 26.6–33.0)
MCHC: 33.1 g/dL (ref 31.5–35.7)
MCV: 78 fL — ABNORMAL LOW (ref 79–97)
PLATELETS: 260 10*3/uL (ref 150–450)
RBC: 4.67 x10E6/uL (ref 3.77–5.28)
RDW: 13 % (ref 11.7–15.4)
WBC: 7.2 10*3/uL (ref 3.4–10.8)

## 2018-06-12 LAB — CMP14 + ANION GAP
A/G RATIO: 1.7 (ref 1.2–2.2)
ALT: 37 IU/L — ABNORMAL HIGH (ref 0–32)
AST: 26 IU/L (ref 0–40)
Albumin: 4.3 g/dL (ref 3.5–5.5)
Alkaline Phosphatase: 100 IU/L (ref 39–117)
Anion Gap: 14 mmol/L (ref 10.0–18.0)
BUN / CREAT RATIO: 16 (ref 9–23)
BUN: 8 mg/dL (ref 6–20)
Bilirubin Total: 0.2 mg/dL (ref 0.0–1.2)
CALCIUM: 9.4 mg/dL (ref 8.7–10.2)
CO2: 20 mmol/L (ref 20–29)
Chloride: 104 mmol/L (ref 96–106)
Creatinine, Ser: 0.5 mg/dL — ABNORMAL LOW (ref 0.57–1.00)
GFR, EST AFRICAN AMERICAN: 141 mL/min/{1.73_m2} (ref >59)
GFR, EST NON AFRICAN AMERICAN: 122 mL/min/{1.73_m2} (ref >59)
GLOBULIN, TOTAL: 2.5 g/dL (ref 1.5–4.5)
Glucose: 110 mg/dL — ABNORMAL HIGH (ref 65–99)
POTASSIUM: 4.2 mmol/L (ref 3.5–5.2)
Sodium: 138 mmol/L (ref 134–144)
TOTAL PROTEIN: 6.8 g/dL (ref 6.0–8.5)

## 2018-06-12 LAB — TSH: TSH: 3.73 u[IU]/mL (ref 0.450–4.500)

## 2018-06-12 LAB — VITAMIN B12: Vitamin B-12: 705 pg/mL (ref 232–1245)

## 2018-06-12 NOTE — Progress Notes (Signed)
Other labs added since she has abnormal labs back.

## 2018-06-18 ENCOUNTER — Other Ambulatory Visit: Payer: Self-pay | Admitting: Internal Medicine

## 2018-06-18 NOTE — Progress Notes (Unsigned)
Pt is supposed to have FU labs and xray this week, but I dont see her on the scheduled this week.  Please call her if she has not made apt. I need to reexamine her, and see if I need to order an MRI if she is not better.

## 2018-06-19 ENCOUNTER — Ambulatory Visit: Payer: Commercial Managed Care - PPO | Admitting: Internal Medicine

## 2018-06-19 ENCOUNTER — Encounter: Payer: Self-pay | Admitting: Internal Medicine

## 2018-06-19 ENCOUNTER — Other Ambulatory Visit: Payer: Self-pay

## 2018-06-19 VITALS — BP 118/68 | HR 91 | Temp 98.5°F | Ht 61.2 in | Wt 173.8 lb

## 2018-06-19 DIAGNOSIS — M545 Low back pain, unspecified: Secondary | ICD-10-CM

## 2018-06-19 DIAGNOSIS — Z712 Person consulting for explanation of examination or test findings: Secondary | ICD-10-CM

## 2018-06-19 DIAGNOSIS — R7309 Other abnormal glucose: Secondary | ICD-10-CM

## 2018-06-19 DIAGNOSIS — R748 Abnormal levels of other serum enzymes: Secondary | ICD-10-CM | POA: Diagnosis not present

## 2018-06-19 DIAGNOSIS — R202 Paresthesia of skin: Secondary | ICD-10-CM | POA: Diagnosis not present

## 2018-06-19 DIAGNOSIS — Z862 Personal history of diseases of the blood and blood-forming organs and certain disorders involving the immune mechanism: Secondary | ICD-10-CM

## 2018-06-19 NOTE — Patient Instructions (Addendum)
Toma vitamina B12 1000 mcg ( sublingual) para derretir debajo de la lengua or liquido.   Yerros: Ferrus Sulfate 325 mg dos veces al dia.         Ejercicios para la espalda Back Exercises Si tiene dolor de espalda, haga estos ejercicios 2 o 3veces por da, o como se lo haya indicado el mdico. Cuando el dolor desaparezca, hgalos una vez por da, pero haga ms repeticiones de cada ejercicio. Si no le duele la espalda, haga estos ejercicios una vez por da o como se lo haya indicado el mdico. Ejercicios Rodilla al pecho Repita estos pasos 3 o 5veces seguidas con cada pierna: 1. Acustese boca arriba sobre una cama dura o sobre el suelo con las piernas extendidas. 2. Lleve una rodilla al pecho. 3. Mantenga la rodilla contra el pecho. Para lograrlo tmese la rodilla o el muslo. 4. Tire de la rodilla hasta sentir una elongacin suave en la parte baja de la espalda. 5. Mantenga la elongacin durante 10 a 30segundos. 6. Suelte y extienda la pierna lentamente. Inclinacin de la pelvis Repita estos pasos 5 o 10veces seguidas: 1. Acustese boca arriba sobre una cama dura o sobre el suelo con las piernas extendidas. 2. Flexione las rodillas de manera que apunten al techo. Los pies deben estar apoyados en el suelo. 3. Contraiga los msculos de la parte baja del vientre (abdomen) para empujar la zona lumbar contra el suelo. Este movimiento har que el cccix apunte hacia el techo, en lugar de apuntar hacia abajo en direccin a los pies o al suelo. 4. Mantenga esta posicin durante 5 a 10segundos mientras contrae suavemente los msculos y respira con normalidad. El perro y el gato Repita estos pasos hasta que la zona lumbar se curve con ms facilidad: 1. Apoye las palmas de las manos y las rodillas sobre una superficie firme. Las manos deben estar alineadas con los hombros y las rodillas con las caderas. Puede colocarse almohadillas debajo de las rodillas. 2. Deje caer la cabeza y lleve el  cccix hacia abajo de modo que apunte en direccin al suelo para que la zona lumbar se arquee como el lomo de un gato Leigh. 3. Mantenga esta posicin durante 5segundos. 4. Lentamente, levante la cabeza y lleve el cccix hacia arriba de modo que apunte en direccin al techo para que la espalda se arquee (hunda) como el lomo de un perro contento. 5. Mantenga esta posicin durante 5segundos.  Flexiones de brazos Repita estos pasos 5 o 10veces seguidas: 1. Acustese boca abajo en el suelo. 2. Ponga las manos cerca de la cabeza, separadas aproximadamente al ancho de los hombros. 3. Con la espalda relajada y las caderas apoyadas en el suelo, extienda lentamente los brazos para levantar la mitad superior del cuerpo y Optometrist los hombros. No use los msculos de la espalda. Para estar ms cmodo, puede cambiar la [employer]. 4. Mantenga esta posicin durante 5segundos. 5. Lentamente vuelva a la posicin horizontal.  Puentes Repita estos pasos 10veces seguidas: 1. Acustese boca arriba sobre una superficie firme. 2. Flexione las rodillas de manera que apunten al techo. Los pies deben estar apoyados en el suelo. 3. Contraiga los glteos y despegue las nalgas del suelo hasta que la cintura est casi a la altura de las rodillas. Si no siente el trabajo muscular en las nalgas y la parte posterior de los muslos, aleje los pies 1 o 2pulgadas (2,5 o 5centmetros) de las nalgas. 4. Mantenga esta posicin durante 3 a 5segundos.  5. Lentamente, vuelva a apoyar las nalgas en el suelo y relaje los glteos. Si este ejercicio le resulta muy fcil, intente realizarlo con los brazos cruzados [place]obre el pecho. Abdominales  Repita estos pasos 5 o 10veces seguidas: 1. Acustese boca arriba sobre una cama dura o sobre el suelo con las piernas extendidas. 2. Flexione las rodillas de manera que apunten al techo. Los pies deben estar apoyados en el suelo. 3. Cruce los World [employer] Services Corporationbrazos sobre el pecho. 4. Baje  levemente el mentn en direccin al pecho, pero no doble el cuello. 5. Contraiga los msculos del abdomen y con lentitud eleve el pecho lo suficiente como para despegar levemente los omplatos del suelo. 6. Lentamente baje el pecho y la cabeza hasta el suelo. Elevaciones de espalda Repita estos pasos 5 o 10veces seguidas: 1. Acustese boca abajo con los brazos a los costados y apoye la frente en el suelo. 2. Contraiga los msculos de las piernas y los glteos. 3. Lentamente despegue el pecho del suelo mientras mantiene las caderas apoyadas en el suelo. Mantenga la nuca alineada con la curvatura de la espalda. Mire hacia el suelo mientras hace este ejercicio. 4. Mantenga esta posicin durante 3 a 5segundos. 5. Lentamente baje el pecho y el rostro hasta el suelo. Comunquese con un mdico si:  El dolor de espalda se vuelve mucho ms intenso cuando hace un ejercicio.  El dolor de espalda no se [place]alivia 2horas despus de ARAMARK Corporationhacer los ejercicios. Si tiene alguno de Limited Brandsestos problemas, deje de ARAMARK Corporationhacer los ejercicios. No vuelva a hacer los ejercicios a menos que el mdico lo autorice. Solicite ayuda de inmediato si:  Siente un dolor sbito y muy intenso en la espalda. Si esto ocurre, deje de Toys 'R' Ushacer los ejercicios. No vuelva a hacer los ejercicios a menos que el mdico lo autorice. Esta informacin no tiene Theme park managercomo fin reemplazar el consejo del mdico. Asegrese de hacerle al mdico cualquier pregunta que tenga. Document Released: 09/06/2010 Document Revised: 02/16/2017 Document Reviewed: 07/16/2014 Elsevier Interactive Patient Education  [employer]2019 Elsevier Inc.

## 2018-06-19 NOTE — Progress Notes (Signed)
  Subjective:     Patient ID: Christy Graham , female    DOB: [birth year] , 38 y.o.   MRN: [id number]   Chief Complaint  Patient presents with  . Back Pain    a little better    HPI Pt is here for FU lumbar spine xray and labs. She is feeling better and has gone back to working.  R shin tingling is better since her back is 90 % better, both hands still get intermittent tingling. Her lumbar back pain only bothers her with prolonged sitting.  Has history of heavy bleeding during menstruation. Has been told by her GYN that she could get anemic, but she never had to take Iron.  She denies taking Tylenol or using ETOH. No past medical history on file.   Family History  Problem Relation Age of Onset  . Diabetes Mother      Current Outpatient Medications:  .  ibuprofen (ADVIL,MOTRIN) 800 MG tablet, Take 1 tablet (800 mg total) by mouth 3 (three) times daily., Disp: 60 tablet, Rfl: 0 .  methocarbamol (ROBAXIN) 750 MG tablet, Take 1-2 tablets (750-1,500 mg total) by mouth 3 (three) times daily as needed for muscle spasms., Disp: 18 tablet, Rfl: 0   No Known Allergies   Review of Systems  Constitutional: Negative for fatigue.  Gastrointestinal: Negative for abdominal pain.       Denies bowel incontinence  Endocrine: Negative for polydipsia and polyphagia.  Genitourinary: Negative for difficulty urinating and enuresis.       Has history of menorrhagia and been told in the past she was anemic. Has never taken Iron, but was prescribed some kind of vitamins by her GYN.   Musculoskeletal: Positive for back pain. Negative for gait problem.       See HPI  Skin: Negative for rash.  Neurological: Positive for numbness. Negative for weakness.     Today's Vitals   06/19/18 1142  BP: 118/68  Pulse: 91  Temp: 98.5 F (36.9 C)  TempSrc: Oral  Weight: 173 lb 12.8 oz (78.8 kg)  Height: 5' 1.2" (1.554 m)   Body mass index is 32.62 kg/m.   Objective:  Physical Exam Musculoskeletal:      Comments: SPINE- has local tenderness on lower lumbar region, but ROM is completely normal. Has mild pain with raising back up from anterior flexion.   Neurological:     Mental Status: She is oriented to person, place, and time.     Deep Tendon Reflexes: Reflexes normal.     Assessment And Plan:    1. Abnormal glucose- acute - Hemoglobin A1c  2. Elevated liver enzymes- new - Hepatitis Acute Panel 3- Lumbar back pain- mostly resolved. No action. 4- Hand paresthesia- her B12 is in the mid level, I will have her try SL B12 1000 mcg qd and see if this helps her paresthesia when she comes for her physical. If not, I will send her to neurology for ENG. Pt is in agreement. 5- History of anemia- H/H today is normal, but shows some microcytosis. I  advised her to take Fe 325 mg one bid.    I reviewed her Lumbar spine xray which was normal, and also her labs which showed as mentioned above including elevated ALT.   Christy Delauter RODRIGUEZ-SOUTHWORTH, PA-C

## 2018-06-20 LAB — HEPATITIS PANEL, ACUTE
HEP B S AG: NEGATIVE
Hep A IgM: NEGATIVE
Hep B C IgM: NEGATIVE
Hep C Virus Ab: <0.1 s/co ratio (ref 0.0–0.9)

## 2018-07-01 LAB — IRON AND TIBC
Iron Saturation: 14 % — ABNORMAL LOW (ref 15–55)
Iron: 52 ug/dL (ref 27–159)
Total Iron Binding Capacity: 385 ug/dL (ref 250–450)
UIBC: 333 ug/dL (ref 131–425)

## 2018-07-01 LAB — HGB A1C W/O EAG: HEMOGLOBIN A1C: 5.4 % (ref 4.8–5.6)

## 2018-07-01 LAB — SPECIMEN STATUS REPORT

## 2018-07-01 LAB — FERRITIN: FERRITIN: 34 ng/mL (ref 15–150)

## 2018-07-10 ENCOUNTER — Encounter: Payer: Commercial Managed Care - PPO | Admitting: Internal Medicine

## 2018-07-16 ENCOUNTER — Ambulatory Visit: Payer: Commercial Managed Care - PPO | Admitting: Internal Medicine

## 2018-07-16 ENCOUNTER — Encounter: Payer: Self-pay | Admitting: Internal Medicine

## 2018-07-16 VITALS — BP 122/76 | HR 88 | Temp 98.4°F | Ht 60.8 in | Wt 177.6 lb

## 2018-07-16 DIAGNOSIS — R740 Nonspecific elevation of levels of transaminase and lactic acid dehydrogenase [LDH]: Secondary | ICD-10-CM | POA: Diagnosis not present

## 2018-07-16 DIAGNOSIS — Z0000 Encounter for general adult medical examination without abnormal findings: Secondary | ICD-10-CM

## 2018-07-16 DIAGNOSIS — Z6833 Body mass index (BMI) 33.0-33.9, adult: Secondary | ICD-10-CM

## 2018-07-16 DIAGNOSIS — G5602 Carpal tunnel syndrome, left upper limb: Secondary | ICD-10-CM

## 2018-07-16 DIAGNOSIS — E669 Obesity, unspecified: Secondary | ICD-10-CM

## 2018-07-16 DIAGNOSIS — D500 Iron deficiency anemia secondary to blood loss (chronic): Secondary | ICD-10-CM | POA: Diagnosis not present

## 2018-07-16 DIAGNOSIS — R7401 Elevation of levels of liver transaminase levels: Secondary | ICD-10-CM

## 2018-07-16 LAB — POCT URINALYSIS DIPSTICK
Bilirubin, UA: NEGATIVE
Glucose, UA: NEGATIVE
Ketones, UA: NEGATIVE
LEUKOCYTES UA: NEGATIVE
Nitrite, UA: NEGATIVE
Protein, UA: NEGATIVE
RBC UA: NEGATIVE
Spec Grav, UA: 1.025 (ref 1.010–1.025)
Urobilinogen, UA: 0.2 E.U./dL
pH, UA: 5.5 (ref 5.0–8.0)

## 2018-07-16 NOTE — Progress Notes (Signed)
Subjective:     Patient ID: Christy Graham , female    DOB: 19-Oct-1980 , 38 y.o.   MRN: [id number]   Chief Complaint  Patient presents with  . Annual Exam    HPI Pt is here for yearly physical, She has a GYN who does her pap's. Last one with breast xm was May 2019. Her next GYN apt will may May 2020. Has been taking her iron BID and is tolerating this well with no constipation.   PMHx- Iron deficiency anemia     Family History  Problem Relation Age of Onset  . Diabetes Mother      Current Outpatient Medications:  .  ferrous sulfate 325 (65 FE) MG tablet, Take 325 mg by mouth 2 (two) times daily with a meal., Disp: , Rfl:  .  ibuprofen (ADVIL,MOTRIN) 800 MG tablet, Take 1 tablet (800 mg total) by mouth 3 (three) times daily., Disp: 60 tablet, Rfl: 0 .  methocarbamol (ROBAXIN) 750 MG tablet, Take 1-2 tablets (750-1,500 mg total) by mouth 3 (three) times daily as needed for muscle spasms., Disp: 18 tablet, Rfl: 0 .  vitamin B-12 (CYANOCOBALAMIN) 100 MCG tablet, Take 100 mcg by mouth 2 (two) times daily., Disp: , Rfl:    No Known Allergies   Review of Systems  Constitutional: Negative for chills, diaphoresis, fatigue and fever.  HENT: Negative.   Eyes: Negative.   Respiratory: Negative for cough and shortness of breath.   Cardiovascular: Negative for chest pain, palpitations and leg swelling.  Gastrointestinal: Negative for abdominal pain, blood in stool, constipation, diarrhea, nausea and vomiting.       On occasion gets GERD depending on what she eats.   Endocrine: Negative for cold intolerance, heat intolerance, polydipsia, polyphagia and polyuria.  Genitourinary: Negative for dysuria, flank pain, frequency and urgency.       LMP- 06/22/2018  Musculoskeletal: Negative.   Skin: Negative for color change, pallor, rash and wound.  Allergic/Immunologic: Negative for environmental allergies and food allergies.  Neurological: Positive for numbness. Negative for dizziness,  tremors and headaches.       Her R leg and R hand numbness has resolved, and still has intermittent of L hand. No changes since last visit. She is right handed.   Psychiatric/Behavioral: Negative for sleep disturbance. The patient is not nervous/anxious.        Denies depression     Today's Vitals   07/16/18 1419  BP: 122/76  Pulse: 88  Temp: 98.4 F (36.9 C)  TempSrc: Oral  SpO2: 97%  Weight: 177 lb 9.6 oz (80.6 kg)  Height: 5' 0.8" (1.544 m)  PainSc: 0-No pain   Body mass index is 33.78 kg/m.   Objective:  Physical Exam  BP 122/76 (BP Location: Left Arm, Patient Position: Sitting, Cuff Size: Small)   Pulse 88   Temp 98.4 F (36.9 C) (Oral)   Ht 5' 0.8" (1.544 m)   Wt 177 lb 9.6 oz (80.6 kg)   LMP 06/22/2018   SpO2 97%   BMI 33.78 kg/m   General Appearance:    Alert, cooperative, no distress, appears stated age  Head:    Normocephalic, without obvious abnormality, atraumatic  Eyes:    PERRL, conjunctiva/corneas clear, EOM's intact, fundi    benign, both eyes  Ears:    Normal TM's and external ear canals, both ears  Nose:   Nares normal, septum midline, mucosa normal, no drainage    or sinus tenderness  Throat:   Lips, mucosa, and  tongue normal; teeth and gums normal  Neck:   Supple, symmetrical, trachea midline, no adenopathy;    thyroid:  no enlargement/tenderness/nodules; no carotid   bruit or JVD  Back:     Symmetric, no curvature, ROM normal, no CVA tenderness  Lungs:     Clear to auscultation bilaterally, respirations unlabored  Chest Wall:    No tenderness or deformity   Heart:    Regular rate and rhythm, S1 and S2 normal, no murmur, rub   or gallop     Abdomen:     Soft, non-tender, bowel sounds active all four quadrants,    no masses, no organomegaly        Extremities:   Extremities normal, atraumatic, no cyanosis or edema  Pulses:   2+ and symmetric all extremities  Skin:   Skin color, texture, turgor normal, no rashes or lesions  Lymph nodes:    Cervical, supraclavicular, and axillary nodes normal  Neurologic:   CNII-XII intact, normal strength, sensation and reflexes    Throughout. Normal Rhomberg, tandem gait, tip toe and heel gait, finger to nose. Has positive Tinnel's and Phalans test on the L.       Assessment And Plan:     1. Encounter for general adult medical examination w abnormal findings- routine - POCT Urinalysis Dipstick (81002)- neg - Lipid Profile  2. Iron deficiency anemia due to chronic blood loss- under treatment. If Iron is normal, was told to take the Fe a couple of days before she starts her period, and during her period.  - Lipid Profile - CMP14 + Anion Gap - CBC no Diff - Iron and TIBC([facility])  3. Elevated ALT measurement- new. I will call her with results.  - CMP14 + Anion Gap  4. Carpal tunnel syndrome on left- chronic. I gave rx for a wrist immobilizer and she is to wear it for 7-14 days and see if the numbness resolves. If symptoms persist, then I need to see her back and I will refer to neurologist.   5. Obesity (BMI 30.0-34.9)- she has already been implementing better eating habits, and will continue with them. She does do a lot of walking already.    Kinte Trim RODRIGUEZ-SOUTHWORTH, PA-C

## 2018-07-16 NOTE — Patient Instructions (Addendum)
 Preventive Care 18-39 Years, Female Preventive care refers to lifestyle choices and visits with your health care provider that can promote health and wellness. What does preventive care include?   A yearly physical exam. This is also called an annual well check.  Dental exams once or twice a year.  Routine eye exams. Ask your health care provider how often you should have your eyes checked.  Personal lifestyle choices, including: ? Daily care of your teeth and gums. ? Regular physical activity. ? Eating a healthy diet. ? Avoiding tobacco and drug use. ? Limiting alcohol use. ? Practicing safe sex. ? Taking vitamin and mineral supplements as recommended by your health care provider. What happens during an annual well check? The services and screenings done by your health care provider during your annual well check will depend on your age, overall health, lifestyle risk factors, and family history of disease. Counseling Your health care provider may ask you questions about your:  Alcohol use.  Tobacco use.  Drug use.  Emotional well-being.  Home and relationship well-being.  Sexual activity.  Eating habits.  Work and work environment.  Method of birth control.  Menstrual cycle.  Pregnancy history. Screening You may have the following tests or measurements:  Height, weight, and BMI.  Diabetes screening. This is done by checking your blood sugar (glucose) after you have not eaten for a while (fasting).  Blood pressure.  Lipid and cholesterol levels. These may be checked every 5 years starting at age 20.  Skin check.  Hepatitis C blood test.  Hepatitis B blood test.  Sexually transmitted disease (STD) testing.  BRCA-related cancer screening. This may be done if you have a family history of breast, ovarian, tubal, or peritoneal cancers.  Pelvic exam and Pap test. This may be done every 3 years starting at age 21. Starting at age 30, this may be done  every 5 years if you have a Pap test in combination with an HPV test. Discuss your test results, treatment options, and if necessary, the need for more tests with your health care provider. Vaccines Your health care provider may recommend certain vaccines, such as:  Influenza vaccine. This is recommended every year.  Tetanus, diphtheria, and acellular pertussis (Tdap, Td) vaccine. You may need a Td booster every 10 years.  Varicella vaccine. You may need this if you have not been vaccinated.  HPV vaccine. If you are 26 or younger, you may need three doses over 6 months.  Measles, mumps, and rubella (MMR) vaccine. You may need at least one dose of MMR. You may also need a second dose.  Pneumococcal 13-valent conjugate (PCV13) vaccine. You may need this if you have certain conditions and were not previously vaccinated.  Pneumococcal polysaccharide (PPSV23) vaccine. You may need one or two doses if you smoke cigarettes or if you have certain conditions.  Meningococcal vaccine. One dose is recommended if you are age 19-21 years and a first-year college student living in a residence hall, or if you have one of several medical conditions. You may also need additional booster doses.  Hepatitis A vaccine. You may need this if you have certain conditions or if you travel or work in places where you may be exposed to hepatitis A.  Hepatitis B vaccine. You may need this if you have certain conditions or if you travel or work in places where you may be exposed to hepatitis B.  Haemophilus influenzae type b (Hib) vaccine. You may need this if   you have certain risk factors. Talk to your health care provider about which screenings and vaccines you need and how often you need them. This information is not intended to replace advice given to you by your health care provider. Make sure you discuss any questions you have with your health care provider. Document Released: 07/18/2001 Document Revised:  01/02/2017 Document Reviewed: 03/23/2015 Elsevier Interactive Patient Education  2019 [employer].  Sndrome del tnel carpiano Carpal Tunnel Syndrome  El sndrome del tnel carpiano es una afeccin que causa dolor en la mano y en el brazo. El tnel carpiano es un rea estrecha ubicada en el lado palmar de la [place]. Los movimientos repetidos de la mueca o determinadas enfermedades pueden causar la hinchazn del tnel. Esta hinchazn comprime el nervio principal de la mueca (nervio mediano). Cules son las causas? Esta afeccin puede ser causada por lo siguiente: Movimientos repetidos de [employer]. Lesiones en la [place]. Artritis. Un quiste o un tumor en el tnel carpiano. Acumulacin de lquido Solicitor. A veces, se desconoce la causa de esta afeccin. Qu incrementa el riesgo? Los siguientes factores pueden hacer que sea ms propenso a Emergency planning/management officer afeccin: Tener un trabajo que requiera mover repetidamente la [place] del mismo modo, por ejemplo, de carnicero o cajero. Ser mujer. Tener ciertas afecciones, tales como: Diabetes. Obesidad. Tiroidea hipoactiva (hipotiroidismo). Insuficiencia renal. Cules son los signos o sntomas? Los sntomas de esta afeccin incluyen: Sensacin de hormigueo en los dedos de la mano, especialmente el pulgar, el ndice y el dedo [place]. Hormigueo o adormecimiento en la mano. Sensacin de Social research officer, government en todo el brazo, especialmente cuando la [place] y el codo estn flexionados durante [place]. Dolor en la mueca que sube por el brazo hasta el hombro. Dolor que baja hasta la palma de la mano o los dedos. Sensacin de [employer]. Tal vez tenga dificultad para tomar y Licensed conveyancer. Los sntomas pueden empeorar durante la noche. Cmo se diagnostica? Esta afeccin se diagnostica mediante los antecedentes mdicos y un examen fsico. Tambin pueden hacerle estudios, que incluyen los siguientes: Furniture conservator/restorer electromiograma (EMG). Esta  prueba mide las seales elctricas que los nervios les envan a los msculos. Estudio de [employer]. Este estudio permite determinar si las seales elctricas pasan correctamente por los nervios. Estudios de diagnstico por imgenes, como radiografas, una ecografa y Public house manager (RM). Estos estudios Chiropodist las posibles causas de la afeccin. Cmo se trata? El tratamiento de esta afeccin puede incluir: Cambios en el estilo de vida. Es importante que deje o cambie la actividad que caus la afeccin. Hacer ejercicio y actividades para fortalecer los msculos y los huesos (fisioterapia). Aprender a usar la mano nuevamente despus del diagnstico (terapia ocupacional). Analgsicos y antiinflamatorios. Esto puede incluir medicamentos que se inyectan en la [place]. Una frula para la [place]. Clementeen Hoof. Siga estas indicaciones en su casa: Si tiene una frula: Use la frula como se lo haya indicado el mdico. Qutesela solamente como se lo haya indicado el mdico. Afloje la frula si los dedos se le adormecen, siente hormigueo o se le enfran y se tornan de Optician, dispensing. Mantenga la frula limpia. Si la frula no es impermeable: No deje que se moje. Cbrala con un envoltorio hermtico cuando tome un bao de inmersin o una ducha. Control del dolor, la rigidez y la hinchazn  Si se lo indican, aplique hielo sobre la zona del dolor: Si tiene una frula desmontable, qutesela como se lo haya indicado el mdico.  Ponga el hielo en una bolsa plstica. Coloque una toalla entre la piel y Therapist, nutritional. Coloque el hielo durante 32mnutos, 2a3veces al da. Indicaciones generales TDelphide venta libre y los recetados solamente como se lo haya indicado el mdico. Descanse la mFernan Lake Villagede toda actividad que le cause dolor. Si la afeccin tiene relacin con eLeander Rams hable con su empleador sCountrywide Financialpueden hEmory por e[place] usar una almohadilla  para apoyar la mueca mientras tipea. Haga los ejercicios como se lo hayan indicado el mdico, el fisioterapeuta o el terapeuta ocupacional. Concurra a todas las visitas de seguimiento como se lo haya indicado el mdico. Esto es importante. Comunquese con un mdico si: Aparecen nuevos sntomas. El dolor no se alivia con los mDynegy Sus sntomas empeoran. Solicite ayuda de inmediato si: Tiene hormigueo o adormecimiento intensos en la mueca o la mano. Resumen El sndrome del tnel carpiano es una afeccin que causa dolor en la mano y en el brazo. Generalmente se debe a movimientos repetidos de lArrow Electronics El sndrome del tnel carpiano se trata mediante cambios en el estilo de vida y medicamentos. Tambin puede indicarse la cLibyan Arab Jamahiriya Siga las indicaciones del mdico sobre el uso de una frula, el descanso de la aVaiden la asistencia a las visitas de seguimiento y lSolicitorpara pedir ayuda. Esta informacin no tiene cMarine scientistel consejo del mdico. Asegrese de hacerle al mdico cualquier pregunta que tenga. Document Released: 05/22/2005 Document Revised: 10/25/2017 Document Reviewed: 10/25/2017 Elsevier Interactive Patient Education  2019 [employer]

## 2018-07-17 ENCOUNTER — Other Ambulatory Visit: Payer: Self-pay | Admitting: Internal Medicine

## 2018-07-17 LAB — LIPID PANEL
Chol/HDL Ratio: 4.2 ratio (ref 0.0–4.4)
Cholesterol, Total: 167 mg/dL (ref 100–199)
HDL: 40 mg/dL (ref >39)
LDL Calculated: 75 mg/dL (ref 0–99)
Triglycerides: 258 mg/dL — ABNORMAL HIGH (ref 0–149)
VLDL Cholesterol Cal: 52 mg/dL — ABNORMAL HIGH (ref 5–40)

## 2018-07-17 LAB — CMP14 + ANION GAP
ALT: 13 IU/L (ref 0–32)
AST: 13 IU/L (ref 0–40)
Albumin/Globulin Ratio: 1.7 (ref 1.2–2.2)
Albumin: 4.4 g/dL (ref 3.8–4.8)
Alkaline Phosphatase: 106 IU/L (ref 39–117)
Anion Gap: 14 mmol/L (ref 10.0–18.0)
BUN/Creatinine Ratio: 21 (ref 9–23)
BUN: 8 mg/dL (ref 6–20)
Bilirubin Total: <0.2 mg/dL (ref 0.0–1.2)
CALCIUM: 9 mg/dL (ref 8.7–10.2)
CO2: 23 mmol/L (ref 20–29)
Chloride: 103 mmol/L (ref 96–106)
Creatinine, Ser: 0.39 mg/dL — ABNORMAL LOW (ref 0.57–1.00)
GFR calc Af Amer: 153 mL/min/{1.73_m2} (ref >59)
GFR calc non Af Amer: 133 mL/min/{1.73_m2} (ref >59)
Globulin, Total: 2.6 g/dL (ref 1.5–4.5)
Glucose: 84 mg/dL (ref 65–99)
Potassium: 3.9 mmol/L (ref 3.5–5.2)
Sodium: 140 mmol/L (ref 134–144)
TOTAL PROTEIN: 7 g/dL (ref 6.0–8.5)

## 2018-07-17 LAB — IRON AND TIBC
Iron Saturation: 14 % — ABNORMAL LOW (ref 15–55)
Iron: 50 ug/dL (ref 27–159)
Total Iron Binding Capacity: 351 ug/dL (ref 250–450)
UIBC: 301 ug/dL (ref 131–425)

## 2018-07-17 LAB — CBC
HEMATOCRIT: 37.9 % (ref 34.0–46.6)
Hemoglobin: 12.1 g/dL (ref 11.1–15.9)
MCH: 26.4 pg — ABNORMAL LOW (ref 26.6–33.0)
MCHC: 31.9 g/dL (ref 31.5–35.7)
MCV: 83 fL (ref 79–97)
Platelets: 246 10*3/uL (ref 150–450)
RBC: 4.58 x10E6/uL (ref 3.77–5.28)
RDW: 13.7 % (ref 11.7–15.4)
WBC: 8.2 10*3/uL (ref 3.4–10.8)

## 2018-07-18 ENCOUNTER — Other Ambulatory Visit: Payer: Self-pay | Admitting: Internal Medicine

## 2018-07-18 DIAGNOSIS — Z23 Encounter for immunization: Secondary | ICD-10-CM

## 2018-07-18 NOTE — Progress Notes (Signed)
tdap orderd and sent to her pharmacy.

## 2018-09-29 ENCOUNTER — Telehealth: Payer: Self-pay | Admitting: Internal Medicine

## 2018-09-29 NOTE — Telephone Encounter (Signed)
I called to check on pt, but voice mail picked up and I left her my  message I was checking on her and to save my cell # in case she needed me.  

## 2018-10-25 ENCOUNTER — Encounter: Payer: Commercial Managed Care - PPO | Admitting: Obstetrics & Gynecology

## 2019-01-14 ENCOUNTER — Ambulatory Visit: Payer: Commercial Managed Care - PPO | Admitting: Internal Medicine

## 2019-01-15 ENCOUNTER — Other Ambulatory Visit: Payer: Self-pay

## 2019-01-15 ENCOUNTER — Encounter: Payer: Self-pay | Admitting: Obstetrics & Gynecology

## 2019-01-15 ENCOUNTER — Ambulatory Visit: Payer: Commercial Managed Care - PPO | Admitting: Obstetrics & Gynecology

## 2019-01-15 VITALS — BP 120/80 | Ht 60.5 in | Wt 175.0 lb

## 2019-01-15 DIAGNOSIS — Z6833 Body mass index (BMI) 33.0-33.9, adult: Secondary | ICD-10-CM | POA: Diagnosis not present

## 2019-01-15 DIAGNOSIS — Z01419 Encounter for gynecological examination (general) (routine) without abnormal findings: Secondary | ICD-10-CM

## 2019-01-15 DIAGNOSIS — Z9851 Tubal ligation status: Secondary | ICD-10-CM

## 2019-01-15 DIAGNOSIS — E6609 Other obesity due to excess calories: Secondary | ICD-10-CM | POA: Diagnosis not present

## 2019-01-15 DIAGNOSIS — E66811 Obesity, class 1: Secondary | ICD-10-CM

## 2019-01-15 NOTE — Progress Notes (Signed)
Christy Graham 1980/11/27 [id number]   History:    38 y.o. [id number] Married x 16 years. S/P TL.  Children  12 daughter, 15 daughter, 71 son and 40 yo daughter.  RP:  Established patient presenting for annual gyn exam   HPI: Status post tubal ligation.  Menses regular every month with normal flow.  No BTB.  Patient taking vitamins with iron.  No pelvic pain.  No pain with intercourse.  History of abnormal Pap about 10 years ago.  Had a colposcopy with biopsies but no treatment necessary per patient.  Normal vaginal secretions.  Urine normal.  Bowel movements normal.  Breasts normal.  Has premenstrual bilateral breast tenderness. BMI 33.61.  Needs to increase physical activity.  Health labs with family physician.  Past medical history,surgical history, family history and social history were all reviewed and documented in the EPIC chart.  Gynecologic History Patient's last menstrual period was 12/26/2018. Contraception: tubal ligation Last Pap: 10/2017. Results were: Negative Last mammogram: Never Bone Density: Never Colonoscopy: Never  Obstetric History OB History  Gravida Para Term Preterm AB Living  5 4 4   1 4   SAB TAB Ectopic Multiple Live Births  1       4    # Outcome Date GA Lbr Len/2nd Weight Sex Delivery Anes PTL Lv  5 Term 05/12/11 [redacted]w[redacted]d  5 lb 9.8 oz (2.545 kg) F CS-LTranv Spinal  LIV  4 SAB 07/06/05 [redacted]w[redacted]d   F  None  DEC  3 Term 02/19/03 [redacted]w[redacted]d   M Vag-Spont None N LIV  2 Term 03/17/99 [redacted]w[redacted]d   F Vag-Spont None N LIV  1 Term 02/18/86 [redacted]w[redacted]d   F Vag-Spont None N LIV     ROS: A ROS was performed and pertinent positives and negatives are included in the history.  GENERAL: No fevers or chills. HEENT: No change in vision, no earache, sore throat or sinus congestion. NECK: No pain or stiffness. CARDIOVASCULAR: No chest pain or pressure. No palpitations. PULMONARY: No shortness of breath, cough or wheeze. GASTROINTESTINAL: No abdominal pain, nausea, vomiting or diarrhea,  melena or bright red blood per rectum. GENITOURINARY: No urinary frequency, urgency, hesitancy or dysuria. MUSCULOSKELETAL: No joint or muscle pain, no back pain, no recent trauma. DERMATOLOGIC: No rash, no itching, no lesions. ENDOCRINE: No polyuria, polydipsia, no heat or cold intolerance. No recent change in weight. HEMATOLOGICAL: No anemia or easy bruising or bleeding. NEUROLOGIC: No headache, seizures, numbness, tingling or weakness. PSYCHIATRIC: No depression, no loss of interest in normal activity or change in sleep pattern.     Exam:   BP 120/80   Ht 5' 0.5" (1.537 m)   Wt 175 lb (79.4 kg)   LMP 12/26/2018   BMI 33.61 kg/m   Body mass index is 33.61 kg/m.  General appearance : Well developed well nourished female. No acute distress HEENT: Eyes: no retinal hemorrhage or exudates,  Neck supple, trachea midline, no carotid bruits, no thyroidmegaly Lungs: Clear to auscultation, no rhonchi or wheezes, or rib retractions  Heart: Regular rate and rhythm, no murmurs or gallops Breast:Examined in sitting and supine position were symmetrical in appearance, no palpable masses or tenderness,  no skin retraction, no nipple inversion, no nipple discharge, no skin discoloration, no axillary or supraclavicular lymphadenopathy Abdomen: no palpable masses or tenderness, no rebound or guarding Extremities: no edema or skin discoloration or tenderness  Pelvic: Vulva: Normal             Vagina: No gross lesions  or discharge  Cervix: No gross lesions or discharge.  Pap reflex done.  Uterus  AV, normal size, shape and consistency, non-tender and mobile  Adnexa  Without masses or tenderness  Anus: Normal   Assessment/Plan:  38 y.o. female for annual exam   1. Encounter for routine gynecological examination with Papanicolaou smear of cervix Normal gynecologic exam.  History of cervical dysplasia about 10 years ago.  Pap reflex done today.  Breast exam normal.  Will start screening mammogram at age  38 next year.  Health labs with family physician.  2. S/P tubal ligation  3. Class 1 obesity due to excess calories without serious comorbidity with body mass index (BMI) of 33.0 to 33.9 in adult Recommend a lower calorie/carb diet such as [place] diet.  Aerobic physical activities 5 times a week and weightlifting every 2 days.  Genia DelMarie-Lyne Oliviagrace Crisanti MD, 11:56 AM 01/15/2019

## 2019-01-16 ENCOUNTER — Ambulatory Visit: Payer: Commercial Managed Care - PPO | Admitting: Internal Medicine

## 2019-01-16 ENCOUNTER — Encounter: Payer: Self-pay | Admitting: Obstetrics & Gynecology

## 2019-01-16 ENCOUNTER — Encounter: Payer: Self-pay | Admitting: Internal Medicine

## 2019-01-16 VITALS — BP 118/82 | HR 90 | Temp 98.6°F | Ht 60.6 in | Wt 174.8 lb

## 2019-01-16 DIAGNOSIS — R202 Paresthesia of skin: Secondary | ICD-10-CM | POA: Diagnosis not present

## 2019-01-16 DIAGNOSIS — Z23 Encounter for immunization: Secondary | ICD-10-CM | POA: Diagnosis not present

## 2019-01-16 DIAGNOSIS — D500 Iron deficiency anemia secondary to blood loss (chronic): Secondary | ICD-10-CM | POA: Diagnosis not present

## 2019-01-16 LAB — PAP IG W/ RFLX HPV ASCU

## 2019-01-16 MED ORDER — TETANUS-DIPHTH-ACELL PERTUSSIS 5-2-15.5 LF-MCG/0.5 IM SUSP: 0.5000 mL | Freq: Once | INTRAMUSCULAR | 0 refills | Status: AC

## 2019-01-16 NOTE — Patient Instructions (Signed)
1. Encounter for routine gynecological examination with Papanicolaou smear of cervix Normal gynecologic exam.  History of cervical dysplasia about 10 years ago.  Pap reflex done today.  Breast exam normal.  Will start screening mammogram at age 38 next year.  Health labs with family physician.  2. S/P tubal ligation  3. Class 1 obesity due to excess calories without serious comorbidity with body mass index (BMI) of 33.0 to 33.9 in adult Recommend a lower calorie/carb diet such as [employer].  Aerobic physical activities 5 times a week and weightlifting every 2 days.  [place] un placer verle hoy!  Voy a informarle de sus [employer] Financial.

## 2019-01-16 NOTE — Progress Notes (Signed)
Subjective:     Patient ID: Christy Graham , female    DOB: 1980/09/18 , 38 y.o.   MRN: [id number]   Chief Complaint  Patient presents with  . Anemia  . Immunizations    TDAP??    HPI 1-FU hand paresthesia and has been wearing her splints qhs. Is better and not all the hand hurts now like beofe, now just middle finger and ring finger. We had spoken if not resolved I would send her to neuro.   2- Anemia - energy has been feeling well and been taking B12 and Iron faithfully. Has not been bleeding heaby with menses.  Has pap and breast xm yesterday.  She never got the TDAP done, her pharmacy had not received it.  No past medical history on file.   Family History  Problem Relation Age of Onset  . Diabetes Mother      Current Outpatient Medications:  .  ferrous sulfate 325 (65 FE) MG tablet, Take 325 mg by mouth 3 (three) times daily. Increased on 07/17/18, Disp: , Rfl:  .  vitamin B-12 (CYANOCOBALAMIN) 100 MCG tablet, Take 100 mcg by mouth 2 (two) times daily., Disp: , Rfl:    No Known Allergies   Review of Systems  Constitutional: Negative for appetite change and fatigue.  Respiratory: Negative for chest tightness and shortness of breath.   Cardiovascular: Negative for chest pain.  Gastrointestinal: Negative for abdominal pain.  Musculoskeletal: Negative for joint swelling.  Skin: Negative for rash.  Neurological: Positive for numbness. Negative for dizziness and light-headedness.     Today's Vitals   01/16/19 1034  BP: 118/82  Pulse: 90  Temp: 98.6 F (37 C)  TempSrc: Oral  SpO2: 97%  Weight: 174 lb 12.8 oz (79.3 kg)  Height: 5' 0.6" (1.539 m)   Body mass index is 33.47 kg/m.   Objective:  Physical Exam Constitutional:      General: She is not in acute distress.    Appearance: She is obese. She is not toxic-appearing.  HENT:     Head: Normocephalic.     Right Ear: External ear normal.     Left Ear: External ear normal.     Nose: Nose normal.  Eyes:      General: No scleral icterus.    Conjunctiva/sclera: Conjunctivae normal.  Neck:     Musculoskeletal: Neck supple.  Cardiovascular:     Rate and Rhythm: Normal rate and regular rhythm.  Pulmonary:     Effort: Pulmonary effort is normal.     Breath sounds: Normal breath sounds.  Musculoskeletal: Normal range of motion.  Skin:    General: Skin is warm.     Capillary Refill: Capillary refill takes less than 2 seconds.     Findings: No rash.  Neurological:     Mental Status: She is alert and oriented to person, place, and time.     Sensory: No sensory deficit.     Motor: No weakness.     Gait: Gait normal.     Deep Tendon Reflexes: Reflexes normal.  Psychiatric:        Mood and Affect: Mood normal.        Behavior: Behavior normal.        Thought Content: Thought content normal.        Judgment: Judgment normal.     Assessment And Plan:    1. Encounter for immunization- routine - Tdap vaccine greater than or equal to 7yo IM  2. Iron deficiency anemia  due to chronic blood loss- past hx of this.  - Iron  3. Paresthesia- not fully resolved - Vitamin B12 I will send her to Neurology for eval and nerve conduction studies.   FU prn.  Jashawna Reever RODRIGUEZ-SOUTHWORTH, PA-C    THE PATIENT IS ENCOURAGED TO PRACTICE SOCIAL DISTANCING DUE TO THE COVID-19 PANDEMIC.

## 2019-01-17 ENCOUNTER — Encounter: Payer: Self-pay | Admitting: Neurology

## 2019-01-17 LAB — VITAMIN B12: Vitamin B-12: 1117 pg/mL (ref 232–1245)

## 2019-01-17 LAB — IRON: Iron: 63 ug/dL (ref 27–159)

## 2019-02-19 ENCOUNTER — Encounter: Payer: Self-pay | Admitting: Neurology

## 2019-02-28 ENCOUNTER — Other Ambulatory Visit: Payer: Self-pay

## 2019-02-28 ENCOUNTER — Ambulatory Visit: Payer: Commercial Managed Care - PPO | Admitting: Neurology

## 2019-02-28 ENCOUNTER — Encounter: Payer: Self-pay | Admitting: Neurology

## 2019-02-28 VITALS — BP 112/68 | HR 74 | Ht 62.0 in | Wt 172.0 lb

## 2019-02-28 DIAGNOSIS — G5603 Carpal tunnel syndrome, bilateral upper limbs: Secondary | ICD-10-CM | POA: Diagnosis not present

## 2019-02-28 MED ORDER — GABAPENTIN 300 MG PO CAPS: ORAL_CAPSULE | ORAL | 5 refills | Status: DC

## 2019-02-28 NOTE — Patient Instructions (Addendum)
Nerve testing of the hands  Start gabapentin 300mg  at bedtime x 1 week, then increase to 1 tablet twice daily   ELECTROMYOGRAM AND NERVE CONDUCTION STUDIES (EMG/NCS) INSTRUCTIONS  How to Prepare The neurologist conducting the EMG will need to know if you have certain medical conditions. Tell the neurologist and other EMG lab personnel if you: . Have a pacemaker or any other electrical medical device . Take blood-thinning medications . Have hemophilia, a blood-clotting disorder that causes prolonged bleeding Bathing Take a shower or bath shortly before your exam in order to remove oils from your skin. Don't apply lotions or creams before the exam.  What to Expect You'll likely be asked to change into a hospital gown for the procedure and lie down on an examination table. The following explanations can help you understand what will happen during the exam.  . Electrodes. The neurologist or a technician places surface electrodes at various locations on your skin depending on where you're experiencing symptoms. Or the neurologist may insert needle electrodes at different sites depending on your symptoms.  . Sensations. The electrodes will at times transmit a tiny electrical current that you may feel as a twinge or spasm. The needle electrode may cause discomfort or pain that usually ends shortly after the needle is removed. If you are concerned about discomfort or pain, you may want to talk to the neurologist about taking a short break during the exam.  . Instructions. During the needle EMG, the neurologist will assess whether there is any spontaneous electrical activity when the muscle is at rest - activity that isn't present in healthy muscle tissue - and the degree of activity when you slightly contract the muscle.  He or she will give you instructions on resting and contracting a muscle at appropriate times. Depending on what muscles and nerves the neurologist is examining, he or she may ask you  to change positions during the exam.  After your EMG You may experience some temporary, minor bruising where the needle electrode was inserted into your muscle. This bruising should fade within several days. If it persists, contact your primary care doctor.     Sndrome del tnel carpiano Carpal Tunnel Syndrome  El sndrome del tnel carpiano es una afeccin que causa dolor en la mano y en el brazo. El tnel carpiano es un rea estrecha ubicada en el lado palmar de la [place]. Los movimientos repetidos de la mueca o determinadas enfermedades pueden causar la hinchazn del tnel. Esta hinchazn comprime el nervio principal de la mueca (nervio mediano). Cules son las causas? Esta afeccin puede ser causada por lo siguiente:  Movimientos repetidos de CHS Incla mueca.  Lesiones en la [place].  Artritis.  Un quiste o un tumor en el tnel carpiano.  Acumulacin de lquido Academic librariandurante el embarazo. A veces, se desconoce la causa de esta afeccin. Qu incrementa el riesgo? Los siguientes factores pueden hacer que sea ms propenso a Clinical cytogeneticistcontraer esta afeccin:  Tener un trabajo que requiera mover repetidamente la [place] del mismo modo, por ejemplo, de carnicero o cajero.  Ser mujer.  Tener ciertas afecciones, tales como: ? Diabetes. ? Obesidad. ? Tiroidea hipoactiva (hipotiroidismo). ? Insuficiencia renal. Cules son los signos o sntomas? Los sntomas de esta afeccin incluyen:  Sensacin de hormigueo en los dedos de la mano, especialmente el pulgar, el ndice y el dedo Coalportmedio.  Hormigueo o adormecimiento en la mano.  Sensacin de Engineer, miningdolor en todo el brazo, especialmente cuando la [place] y el codo estn flexionados durante  mucho tiempo.  Dolor en la mueca que sube por el brazo hasta el hombro.  Dolor que baja hasta la palma de la mano o los dedos.  Sensacin de [employer]. Tal vez tenga dificultad para tomar y Licensed conveyancer. Los sntomas pueden empeorar durante la  noche. Cmo se diagnostica? Esta afeccin se diagnostica mediante los antecedentes mdicos y un examen fsico. Tambin pueden hacerle estudios, que incluyen los siguientes:  Furniture conservator/restorer electromiograma (EMG). Esta prueba mide las seales elctricas que los nervios les envan a los msculos.  Estudio de [employer]. Este estudio permite determinar si las seales elctricas pasan correctamente por los nervios.  Estudios de diagnstico por imgenes, como radiografas, una ecografa y Public house manager (RM). Estos estudios Chiropodist las posibles causas de la afeccin. Cmo se trata? El tratamiento de esta afeccin puede incluir:  Cambios en el estilo de vida. Es importante que deje o cambie la actividad que caus la afeccin.  Hacer ejercicio y actividades para fortalecer los msculos y los huesos (fisioterapia).  Aprender a usar la mano nuevamente despus del diagnstico (terapia ocupacional).  Analgsicos y antiinflamatorios. Esto puede incluir medicamentos que se inyectan en la [place].  Una frula para la [place].  Clementeen Hoof. Siga estas indicaciones en su casa: Si tiene una frula:  Use la frula como se lo haya indicado el mdico. Qutesela solamente como se lo haya indicado el mdico.  Afloje la frula si los dedos se le adormecen, siente hormigueo o se le enfran y se tornan de Optician, dispensing.  Mantenga la frula limpia.  Si la frula no es impermeable: ? No deje que se moje. ? Cbrala con un envoltorio hermtico cuando tome un bao de inmersin o [place]. Control del dolor, la rigidez y la hinchazn   Si se lo indican, aplique hielo sobre la zona del dolor: ? Si tiene una frula desmontable, qutesela como se lo haya indicado el mdico. ? Ponga el hielo en una bolsa plstica. ? Coloque una [employer] piel y Therapist, nutritional. ? Coloque el hielo durante 60minutos, 2a3veces al da. Indicaciones generales  Delphi de venta libre y los  recetados solamente como se lo haya indicado el mdico.  Descanse la [place] de toda actividad que le cause dolor. Si la afeccin tiene relacin con Leander Rams, hable con su empleador [employer] pueden [place], por [place], usar una almohadilla para apoyar la mueca mientras tipea.  Haga los ejercicios como se lo hayan indicado el mdico, el fisioterapeuta o el terapeuta ocupacional.  Concurra a todas las visitas de seguimiento como se lo haya indicado el mdico. Esto es importante. Comunquese con un mdico si:  Aparecen nuevos sntomas.  El dolor no se alivia con los [employer]ne[employer].  Sus sntomas empeoran. Solicite ayuda de inmediato si:  Tiene hormigueo o adormecimiento intensos en la mueca o la mano. Resumen  El sndrome del tnel carpiano es una afeccin que causa dolor en la mano y en el brazo.  Generalmente se debe a movimientos repetidos de [employer].  El sndrome del tnel carpiano se trata mediante cambios en el estilo de vida y medicamentos. Tambin puede indicarse la [place].  Siga las indicaciones del mdico sobre el uso de una frula, el descanso de la [place], la asistencia a las visitas de seguimiento y Solicitor para pedir ayuda. Esta informacin no tiene Marine scientist el consejo del mdico. Asegrese de hacerle al mdico cualquier pregunta que tenga. Document Released: 05/22/2005 Document  Revised: 10/25/2017 Document Reviewed: 10/25/2017 Elsevier Patient Education  2020 [employer].

## 2019-02-28 NOTE — Progress Notes (Signed)
[facility] Clinic Note - Initial Visit   Date: 02/28/19  Christy Graham MRN: [id number] DOB: Oct 31, 1980   Dear Sunday Spillers Rodriguez-Southworth:  Thank you for your kind referral of Christy Graham for consultation of bilateral hand numbness. Although her history is well known to you, please allow [place] to reiterate it for the purpose of our medical record. The patient was accompanied to the clinic by self.  History of Present Illness: Christy Graham is a 38 y.o. right-handed female with anemia presenting for evaluation of hand numbness.   Starting around May 2020, she began having numbness over the index finger, middle finger, and ring ringer involving both hands.  It is worse at night time and wakes her up from sleeping. She often wakes up shaking her hands and extending it out of the bed. She has tried using a wrist splint which provides some relief. No weakness with grip or gasping.  Vitamin B12 and HbA1c is normal.   She works for housekeeping at a hotel.    Out-side paper records, electronic medical record, and images have been reviewed where available and summarized as:  Lab Results  Component Value Date   HGBA1C 5.4 06/11/2018   Lab Results  Component Value Date   VITAMINB12 1,117 01/16/2019   Lab Results  Component Value Date   TSH 3.730 06/11/2018   No results found for: ESRSEDRATE, POCTSEDRATE  Past Medical History:  Diagnosis Date  . Anemia     Past Surgical History:  Procedure Laterality Date  . CESAREAN SECTION  05/12/2011   Procedure: CESAREAN SECTION;  Surgeon: Frederico Hamman, MD;  Location: [facility];  Service: Gynecology;  Laterality: N/A;  . EYE SURGERY Bilateral 06/06/2015   removal of ptyrigium  . TUBAL LIGATION       Medications:  Outpatient Encounter Medications as of 02/28/2019  Medication Sig  . ferrous sulfate 325 (65 FE) MG tablet Take 325 mg by mouth 3 (three) times daily. Increased on 07/17/18  .  vitamin B-12 (CYANOCOBALAMIN) 100 MCG tablet Take 100 mcg by mouth 2 (two) times daily.   No facility-administered encounter medications on file as of 02/28/2019.     Allergies: No Known Allergies  Family History: Family History  Problem Relation Age of Onset  . Diabetes Mother     Social History: Social History   Tobacco Use  . Smoking status: Never Smoker  . Smokeless tobacco: Never Used  Substance Use Topics  . Alcohol use: Yes    Alcohol/week: 0.0 standard drinks    Comment: RARE  . Drug use: No   Social History   Social History Narrative   4 children    One story house   Right handed    Review of Systems:  CONSTITUTIONAL: No fevers, chills, night sweats, or weight loss.   EYES: No visual changes or eye pain ENT: No hearing changes.  No history of nose bleeds.   RESPIRATORY: No cough, wheezing and shortness of breath.   CARDIOVASCULAR: Negative for chest pain, and palpitations.   GI: Negative for abdominal discomfort, blood in stools or black stools.  No recent change in bowel habits.   GU:  No history of incontinence.   MUSCLOSKELETAL: No history of joint pain or swelling.  No myalgias.   SKIN: Negative for lesions, rash, and itching.   HEMATOLOGY/ONCOLOGY: Negative for prolonged bleeding, bruising easily, and swollen nodes.  No history of cancer.   ENDOCRINE: Negative for cold or heat intolerance, polydipsia or goiter.  PSYCH:  No depression or anxiety symptoms.   NEURO: As Above.   Vital Signs:  BP 112/68   Pulse 74   Ht 5\' 2"  (1.575 m)   Wt 172 lb (78 kg)   SpO2 98%   BMI 31.46 kg/m    General Medical Exam:   General:  Well appearing, comfortable.   Eyes/ENT: see cranial nerve examination.   Neck:   No carotid bruits. Respiratory:  Clear to auscultation, good air entry bilaterally.   Cardiac:  Regular rate and rhythm, no murmur.   Extremities:  No deformities, edema, or skin discoloration.  Skin:  No rashes or lesions.  Neurological Exam:  MENTAL STATUS including orientation to time, place, person, recent and remote memory, attention span and concentration, language, and fund of knowledge is normal.  Speech is not dysarthric.  CRANIAL NERVES: II:  No visual field defects.     III-IV-VI: Pupils equal round and reactive to light.  Normal conjugate, extra-ocular eye movements in all directions of gaze.  No nystagmus.  No ptosis.   V:  Normal facial sensation.    VII:  Normal facial symmetry and movements.   VIII:  Normal hearing and vestibular function.   IX-X:  Normal palatal movement.   XI:  Normal shoulder shrug and head rotation.   XII:  Normal tongue strength and range of motion, no deviation or fasciculation.  MOTOR:  No atrophy, fasciculations or abnormal movements.  No pronator drift.   Upper Extremity:  Right  Left  Deltoid  5/5   5/5   Biceps  5/5   5/5   Triceps  5/5   5/5   Infraspinatus 5/5  5/5  Medial pectoralis 5/5  5/5  Wrist extensors  5/5   5/5   Wrist flexors  5/5   5/5   Finger extensors  5/5   5/5   Finger flexors  5/5   5/5   Dorsal interossei  5/5   5/5   Abductor pollicis  5/5   5/5   Tone (Ashworth scale)  0  0   Lower Extremity:  Right  Left  Hip flexors  5/5   5/5   Hip extensors  5/5   5/5   Adductor 5/5  5/5  Abductor 5/5  5/5  Knee flexors  5/5   5/5   Knee extensors  5/5   5/5   Dorsiflexors  5/5   5/5   Plantarflexors  5/5   5/5   Toe extensors  5/5   5/5   Toe flexors  5/5   5/5   Tone (Ashworth scale)  0  0   MSRs:  Right        Left                  brachioradialis 2+  2+  biceps 2+  2+  triceps 2+  2+  patellar 2+  2+  ankle jerk 2+  2+  Hoffman no  no  plantar response down  down   SENSORY:  Reduced pin prick over the left median distribution only.  Otherwise, normal and symmetric perception of light touch, pinprick, vibration, and proprioception.    COORDINATION/GAIT: Normal finger-to- nose-finger.  Intact rapid alternating movements bilaterally.  Gait narrow  based and stable.   IMPRESSION: Bilateral hand paresthesias, most suggestive of bilateral carpal tunnel syndrome  - NCS/EMG of the arms  - Start gabapentin 300mg  at bedtime x 1 week, then increase to 300mg  BID.  Side  effects of sedation discussed  - Continue to use wrist splints  Further recommendations pending results.   Thank you for allowing me to participate in patient's care.  If I can answer any additional questions, I would be pleased to do so.    Sincerely,    Marnette Perkins K. Allena KatzPatel, DO

## 2019-03-06 ENCOUNTER — Encounter: Payer: Self-pay | Admitting: Internal Medicine

## 2019-03-06 ENCOUNTER — Other Ambulatory Visit: Payer: Self-pay

## 2019-03-06 ENCOUNTER — Ambulatory Visit: Payer: Commercial Managed Care - PPO | Admitting: Internal Medicine

## 2019-03-06 VITALS — BP 118/78 | HR 84 | Temp 98.0°F | Ht 60.4 in | Wt 170.6 lb

## 2019-03-06 DIAGNOSIS — Z0000 Encounter for general adult medical examination without abnormal findings: Secondary | ICD-10-CM | POA: Diagnosis not present

## 2019-03-06 DIAGNOSIS — Z1231 Encounter for screening mammogram for malignant neoplasm of breast: Secondary | ICD-10-CM | POA: Diagnosis not present

## 2019-03-06 DIAGNOSIS — Z23 Encounter for immunization: Secondary | ICD-10-CM

## 2019-03-06 DIAGNOSIS — E7800 Pure hypercholesterolemia, unspecified: Secondary | ICD-10-CM | POA: Diagnosis not present

## 2019-03-06 DIAGNOSIS — R748 Abnormal levels of other serum enzymes: Secondary | ICD-10-CM | POA: Insufficient documentation

## 2019-03-06 DIAGNOSIS — E669 Obesity, unspecified: Secondary | ICD-10-CM | POA: Insufficient documentation

## 2019-03-06 DIAGNOSIS — R202 Paresthesia of skin: Secondary | ICD-10-CM

## 2019-03-06 LAB — POCT URINALYSIS DIPSTICK
Bilirubin, UA: NEGATIVE
Blood, UA: NEGATIVE
Glucose, UA: NEGATIVE
Ketones, UA: NEGATIVE
Leukocytes, UA: NEGATIVE
Nitrite, UA: NEGATIVE
Protein, UA: POSITIVE — AB
Spec Grav, UA: 1.025 (ref 1.010–1.025)
Urobilinogen, UA: 0.2 E.U./dL
pH, UA: 6.5 (ref 5.0–8.0)

## 2019-03-06 NOTE — Progress Notes (Signed)
Subjective:     Patient ID: Christy Graham , female    DOB: 1981/01/23 , 38 y.o.   MRN: [id number]   Chief Complaint  Patient presents with  . Annual Exam    HPI  She is here for yearly physical. Has seen neurologist for tingling on fingers and will have a nerve conduction study next visit. She was placed on gabapentin qhs and is helping her rest with less discomfort.  She had breast xm and pap May this year which was normal, but has not had a mammogram.   Past Medical History:  Diagnosis Date  . Anemia    Hyperlipidemia Paresthesia fingers  Family History  Problem Relation Age of Onset  . Diabetes Mother      Current Outpatient Medications:  .  ferrous sulfate 325 (65 FE) MG tablet, Take 325 mg by mouth 3 (three) times daily. Increased on 07/17/18, Disp: , Rfl:  .  gabapentin (NEURONTIN) 300 MG capsule, Take 1 tablet at bedtime x 1 week, then increase to 1 tablet in morning and bedtime., Disp: 60 capsule, Rfl: 5 .  vitamin B-12 (CYANOCOBALAMIN) 100 MCG tablet, Take 100 mcg by mouth 2 (two) times daily., Disp: , Rfl:    No Known Allergies   Review of Systems  + tingling of fingers which is better at night time since taking Gabapentin prescribed by the neurologist. The rest of ROS is neg.  Today's Vitals   03/06/19 1036  BP: 118/78  Pulse: 84  Temp: 98 F (36.7 C)  TempSrc: Oral  Weight: 170 lb 9.6 oz (77.4 kg)  Height: 5' 0.4" (1.534 m)   Body mass index is 32.88 kg/m.   Objective:  Physical Exam  BP 118/78 (BP Location: Left Arm, Patient Position: Sitting, Cuff Size: Normal)   Pulse 84   Temp 98 F (36.7 C) (Oral)   Ht 5' 0.4" (1.534 m)   Wt 170 lb 9.6 oz (77.4 kg)   LMP 02/28/2019 (Exact Date)   BMI 32.88 kg/m   General Appearance:    Alert, cooperative, no distress, appears stated age  Head:    Normocephalic, without obvious abnormality, atraumatic  Eyes:    PERRL, conjunctiva/corneas clear, EOM's intact  both eyes  Ears:    Normal TM's and  external ear canals, both ears  Nose:   Nares normal, septum midline, mucosa normal, no drainage    or sinus tenderness  Throat:   Lips, mucosa, and tongue normal; teeth and gums normal  Neck:   Supple, symmetrical, trachea midline, no adenopathy;    thyroid:  no enlargement/tenderness/nodules; no carotid   bruit  Back:     Symmetric, no curvature, ROM normal, no CVA tenderness  Lungs:     Clear to auscultation bilaterally, respirations unlabored  Chest Wall:    No tenderness or deformity   Heart:    Regular rate and rhythm, S1 and S2 normal, no murmur, rub   or gallop     Abdomen:     Soft, non-tender, bowel sounds active all four quadrants,    no masses, no organomegaly        Extremities:   Extremities normal, atraumatic, no cyanosis or edema  Pulses:   2+ and symmetric all extremities  Skin:   Skin color, texture, turgor normal, no rashes or lesions  Lymph nodes:   Cervical, supraclavicular, and axillary nodes normal  Neurologic:   CNII-XII intact, normal strength, sensation and reflexes    Throughout. Normal Romberg, tandem gait, tip  toe and heel gait.       Assessment And Plan:  1. Need for influenza vaccination - Flu Vaccine QUAD 6+ mos PF IM (Fluarix Quad PF)  2. Encounter for physical examination- routine. Fu 1 y - POCT Urinalysis Dipstick (81002) - Lipid Profile - CMP14 + Anion Gap - CBC no Diff 4. Pure hypercholesterolemia- chrornic. Diet controlled - Lipid Profile Fu 6 months 5. Encounter for screening mammogram for malignant neoplasm of breast- routine - MM Digital Screening; Future  6. Obesity (BMI 30-39.9)- improving wt loss, encouraged to continue loosing wt.   7. Paresthesia of hand, bilateral- chronic. May continue Fu with her neurologist.    Amit Meloy RODRIGUEZ-SOUTHWORTH, PA-C    THE PATIENT IS ENCOURAGED TO PRACTICE SOCIAL DISTANCING DUE TO THE COVID-19 PANDEMIC.

## 2019-03-06 NOTE — Patient Instructions (Signed)
Cuidados preventivos en las mujeres de 21 a 39 aos de edad Preventive Care 21-39 Years Old, Female Los cuidados preventivos hacen referencia a las opciones en cuanto a las visitas al mdico y al estilo de vida, las cuales pueden promover la salud y el bienestar. Esto puede comprender lo siguiente:  Un examen fsico anual. Esto tambin se puede llamar control de bienestar anual.  Visitas regulares al dentista y exmenes oculares.  Vacunas.  Estudios para detectar ciertas enfermedades.  Opciones saludables de estilo de vida, como seguir una dieta saludable, hacer ejercicio regularmente, no usar drogas ni productos que contengan nicotina y tabaco, y limitar el consumo de alcohol. Qu puedo esperar para mi visita de cuidado preventivo? Examen fsico El mdico revisar lo siguiente:  Estatura y peso. Esto se puede usar para calcular el ndice de masa corporal (IMC), que indica si tiene un peso saludable.  Frecuencia cardaca y presin arterial.  Piel para detectar manchas anormales. Asesoramiento Su mdico puede preguntarle acerca de:  Consumo de tabaco, alcohol y drogas.  Su bienestar emocional.  El bienestar en el hogar y sus relaciones personales.  Su actividad sexual.  Sus hbitos de alimentacin.  Su trabajo y ambiente laboral.  Mtodos anticonceptivos.  Su ciclo menstrual.  Sus antecedentes de embarazo. Qu vacunas necesito?  Vacuna antigripal  Se recomienda aplicarse esta vacuna todos los aos. Vacuna contra el ttanos, difteria y tos ferina (Tdap)  Es posible que tenga que aplicarse un refuerzo contra el ttanos y la difteria (DT) cada 10aos. Vacuna contra la varicela  Es posible que tenga que aplicrsela si no recibi esta vacuna. Vacuna contra el virus del papiloma humano (VPH)  Si el mdico se lo recomienda, puede necesitar tres dosis a lo largo de 6 meses. Vacuna contra el sarampin, rubola y paperas (SRP)  Tendr que aplicarse por lo menos una  dosis de la SRP. Podra tambin necesitar una segunda dosis. Vacuna antimeningoccica conjugada (MenACWY)  Se recomienda la aplicacin de una dosis si tiene entre 19 y 21aos, y es estudiante universitario de primer ao que vive en una residencia estudiantil, o si tiene una de varias enfermedades. Podra tambin necesitar dosis de refuerzo. Vacuna antineumoccica conjugada (PCV13)  Puede necesitar esta vacuna si tiene determinadas enfermedades y no se vacun anteriormente. Vacuna antineumoccica de polisacridos (PPSV23)  Quizs tenga que aplicarse una o dos dosis si fuma o si tiene determinadas afecciones. Vacuna contra la hepatitis A  Es posible que necesite esta vacuna si tiene ciertas afecciones o si viaja o trabaja en lugares en los que podra estar expuesta a la hepatitis A. Vacuna contra la hepatitis B  Es posible que necesite esta vacuna si tiene ciertas afecciones o si viaja o trabaja en lugares en los que podra estar expuesta a la hepatitis B. Vacuna antihaemophilus influenzae tipo B (Hib)  Puede necesitar esta vacuna si tiene determinadas afecciones. Puede recibir las vacunas en forma de dosis individuales o en forma de dos o ms vacunas juntas en la misma inyeccin (vacunas combinadas). Hable con su mdico sobre los riesgos y beneficios de las vacunas combinadas. Qu pruebas necesito?  Anlisis de sangre  Niveles de lpidos y colesterol. Estos se pueden verificar cada 5 aos, a partir de los 20 aos de edad.  Anlisis de hepatitisC.  Anlisis de hepatitisB. Pruebas de deteccin  Pruebas de deteccin de la diabetes. Esto se realiza mediante un control del azcar en la sangre (glucosa) despus de no haber comido durante un periodo de tiempo (ayuno).    Anlisis de enfermedades de transmisin sexual (ETS).  Pruebas de deteccin de cncer relacionado con las mutaciones del BRCA. Es posible que se las deba realizar si tiene antecedentes de cncer de mama, de ovario,  de trompas o peritoneal.  Examen plvico y prueba de Papanicolaou. Esto se puede realizar cada 3aos a partir de los 21aos de edad. A partir de los 30 aos, esto se puede realizar cada 5 aos si usted se realiza una prueba de Papanicolaou en combinacin con una prueba de deteccin del virus del papiloma humano (VPH). Hable con su mdico sobre los resultados de las pruebas, las opciones de tratamiento y, si corresponde, la necesidad de realizar ms pruebas. Siga estas instrucciones en su casa: Comida y bebida   Siga una dieta que incluya frutas frescas y verduras, cereales integrales, lcteos descremados y protenas magras.  Tome los suplementos vitamnicos y minerales como se lo haya indicado el mdico.  No beba alcohol si: ? Su mdico le indica no hacerlo. ? Est embarazada, puede estar embarazada o est tratando de quedar embarazada.  Si bebe alcohol: ? Limite la cantidad que consume de 0 a 1 medida por da. ? Est atenta a la cantidad de alcohol que hay en las bebidas que toma. En [place], una medida equivale a una botella de cerveza de 12oz (355ml), un vaso de vino de 5oz (148ml) o un vaso de una bebida alcohlica de alta graduacin de 1oz (44ml). Estilo de vida  Cudese los dientes y las encas a diario.  Mantngase activa. Haga al menos 30minutos de ejercicio 5o ms das de la semana.  No consuma ningn producto que contenga nicotina o tabaco, como cigarrillos, cigarrillos electrnicos y tabaco de mascar. Si necesita ayuda para dejar de fumar, consulte al mdico.  Si es sexualmente activa, practique sexo seguro. Use un condn u otra forma de mtodo anticonceptivo (anticonceptivos) a fin de evitar un embarazo e ITS (infecciones de transmisin sexual). Si busca un embarazo, realice una consulta previa a la concepcin con el mdico. Cundo volver?  Visite al mdico una vez al ao para una visita de control.  Pregntele al mdico con qu frecuencia debe  realizarse un control de la vista y los dientes.  Mantenga su esquema de vacunacin al da. Esta informacin no tiene como fin reemplazar el consejo del mdico. Asegrese de hacerle al mdico cualquier pregunta que tenga. Document Released: 03/01/2005 Document Revised: 03/07/2018 Document Reviewed: 03/07/2018 Elsevier Patient Education  2020 Elsevier Inc.  

## 2019-03-07 LAB — CBC
Hematocrit: 39.6 % (ref 34.0–46.6)
Hemoglobin: 12.9 g/dL (ref 11.1–15.9)
MCH: 26.3 pg — ABNORMAL LOW (ref 26.6–33.0)
MCHC: 32.6 g/dL (ref 31.5–35.7)
MCV: 81 fL (ref 79–97)
Platelets: 238 10*3/uL (ref 150–450)
RBC: 4.9 x10E6/uL (ref 3.77–5.28)
RDW: 13.3 % (ref 11.7–15.4)
WBC: 6.1 10*3/uL (ref 3.4–10.8)

## 2019-03-07 LAB — LIPID PANEL
Chol/HDL Ratio: 4.2 ratio (ref 0.0–4.4)
Cholesterol, Total: 188 mg/dL (ref 100–199)
HDL: 45 mg/dL (ref >39)
LDL Chol Calc (NIH): 116 mg/dL — ABNORMAL HIGH (ref 0–99)
Triglycerides: 153 mg/dL — ABNORMAL HIGH (ref 0–149)
VLDL Cholesterol Cal: 27 mg/dL (ref 5–40)

## 2019-03-07 LAB — CMP14 + ANION GAP
ALT: 22 IU/L (ref 0–32)
AST: 14 IU/L (ref 0–40)
Albumin/Globulin Ratio: 1.6 (ref 1.2–2.2)
Albumin: 4.5 g/dL (ref 3.8–4.8)
Alkaline Phosphatase: 123 IU/L — ABNORMAL HIGH (ref 39–117)
Anion Gap: 13 mmol/L (ref 10.0–18.0)
BUN/Creatinine Ratio: 15 (ref 9–23)
BUN: 8 mg/dL (ref 6–24)
Bilirubin Total: 0.4 mg/dL (ref 0.0–1.2)
CO2: 24 mmol/L (ref 20–29)
Calcium: 9.2 mg/dL (ref 8.7–10.2)
Chloride: 102 mmol/L (ref 96–106)
Creatinine, Ser: 0.53 mg/dL — ABNORMAL LOW (ref 0.57–1.00)
GFR calc Af Amer: 137 mL/min/{1.73_m2} (ref >59)
GFR calc non Af Amer: 119 mL/min/{1.73_m2} (ref >59)
Globulin, Total: 2.9 g/dL (ref 1.5–4.5)
Glucose: 92 mg/dL (ref 65–99)
Potassium: 4 mmol/L (ref 3.5–5.2)
Sodium: 139 mmol/L (ref 134–144)
Total Protein: 7.4 g/dL (ref 6.0–8.5)

## 2019-03-10 ENCOUNTER — Ambulatory Visit: Payer: Commercial Managed Care - PPO | Admitting: Neurology

## 2019-03-13 ENCOUNTER — Encounter: Payer: Self-pay | Admitting: Internal Medicine

## 2019-03-13 ENCOUNTER — Other Ambulatory Visit: Payer: Self-pay | Admitting: Internal Medicine

## 2019-03-13 DIAGNOSIS — R748 Abnormal levels of other serum enzymes: Secondary | ICD-10-CM

## 2019-03-13 NOTE — Progress Notes (Signed)
Alk Phos isoenzyme ordered

## 2019-04-10 ENCOUNTER — Other Ambulatory Visit: Payer: Self-pay

## 2019-04-10 ENCOUNTER — Ambulatory Visit (INDEPENDENT_AMBULATORY_CARE_PROVIDER_SITE_OTHER): Payer: Commercial Managed Care - PPO | Admitting: Neurology

## 2019-04-10 ENCOUNTER — Telehealth: Payer: Self-pay | Admitting: Neurology

## 2019-04-10 DIAGNOSIS — G5603 Carpal tunnel syndrome, bilateral upper limbs: Secondary | ICD-10-CM

## 2019-04-10 NOTE — Telephone Encounter (Signed)
Results of EMG discussed with patient which showed severe bilateral CTS.  Refer to Ortho for evaluation of CTS release.  

## 2019-04-10 NOTE — Procedures (Signed)
[facility]  [address] [place], [address]  [place], [place] [address] Tel: [phone] Fax:  [phone] Test Date:  04/10/2019  Patient: Ressie Slevin DOB: 10-26-1980 Physician: Nita Sickle, DO  Sex: Female Height: 5\' 0"  Ref Phys: , DO  ID#: Nita Sickle Temp: 33.0C Technician:    Patient Complaints: This is a 38 year old female referred for evaluation of bilateral hand paresthesias.  NCV & EMG Findings: Extensive electrodiagnostic testing of the right upper extremity and additional studies of the left shows:  1. Bilateral median sensory responses show prolonged distal onset latency (R5.6, L7.3 ms) and reduced amplitude (R6.4, L9.7 V).  Bilateral ulnar sensory responses are within normal limits. 2. Bilateral median motor responses show markedly prolonged latency latency (R7.8, L8.1 ms) and reduced amplitude (R5.4, L3.8 mV).  Bilateral ulnar motor responses are within normal limits. 3. Chronic motor axonal loss changes are seen affecting bilateral abductor pollicis brevis muscles, without accompanied active denervation.    Impression: Bilateral median neuropathy at or distal to the wrist (severe), consistent with a clinical diagnosis of carpal tunnel syndrome.     ___________________________ 39, DO    Nerve Conduction Studies Anti Sensory Summary Table   Site NR Peak (ms) Norm Peak (ms) P-T Amp (V) Norm P-T Amp  Left Median Anti Sensory (2nd Digit)  33C  Wrist    7.3 <3.4 9.7 >20  Right Median Anti Sensory (2nd Digit)  33C  Wrist    5.6 <3.4 6.4 >20  Left Ulnar Anti Sensory (5th Digit)  33C  Wrist    2.3 <3.1 45.8 >12  Right Ulnar Anti Sensory (5th Digit)  33C  Wrist    2.1 <3.1 42.1 >12   Motor Summary Table   Site NR Onset (ms) Norm Onset (ms) O-P Amp (mV) Norm O-P Amp Site1 Site2 Delta-0 (ms) Dist (cm) Vel (m/s) Norm Vel (m/s)  Left Median Motor (Abd Poll Brev)  33C  Wrist    8.1 <3.9 3.8 >6 Elbow Wrist 4.5 28.0 62 >50   Elbow    12.6  3.2         Right Median Motor (Abd Poll Brev)  33C  Wrist    7.8 <3.9 5.4 >6 Elbow Wrist 4.5 27.0 60 >50  Elbow    12.3  4.9         Left Ulnar Motor (Abd Dig Minimi)  33C  Wrist    2.0 <3.1 10.8 >7 B Elbow Wrist 3.2 22.0 69 >50  B Elbow    5.2  10.3  A Elbow B Elbow 1.6 10.0 63 >50  A Elbow    6.8  9.7         Right Ulnar Motor (Abd Dig Minimi)  33C  Wrist    2.0 <3.1 11.6 >7 B Elbow Wrist 3.2 21.5 67 >50  B Elbow    5.2  11.4  A Elbow B Elbow 1.7 10.0 59 >50  A Elbow    6.9  11.0          EMG   Side Muscle Ins Act Fibs Psw Fasc Number Recrt Dur Dur. Amp Amp. Poly Poly. Comment  Right 1stDorInt Nml Nml Nml Nml Nml Nml Nml Nml Nml Nml Nml Nml N/A  Right Abd Poll Brev Nml Nml Nml Nml 1- Rapid Few 1+ Few 1+ Nml Nml N/A  Right PronatorTeres Nml Nml Nml Nml Nml Nml Nml Nml Nml Nml Nml Nml N/A  Right Biceps Nml Nml Nml Nml Nml Nml Nml  Nml Nml Nml Nml Nml N/A  Right Triceps Nml Nml Nml Nml Nml Nml Nml Nml Nml Nml Nml Nml N/A  Right Deltoid Nml Nml Nml Nml Nml Nml Nml Nml Nml Nml Nml Nml N/A  Left 1stDorInt Nml Nml Nml Nml Nml Nml Nml Nml Nml Nml Nml Nml N/A  Left Abd Poll Brev Nml Nml Nml Nml 1- Rapid Some 1+ Some 1+ Nml Nml N/A  Left PronatorTeres Nml Nml Nml Nml Nml Nml Nml Nml Nml Nml Nml Nml N/A  Left Biceps Nml Nml Nml Nml Nml Nml Nml Nml Nml Nml Nml Nml N/A  Left Triceps Nml Nml Nml Nml Nml Nml Nml Nml Nml Nml Nml Nml N/A  Left Deltoid Nml Nml Nml Nml Nml Nml Nml Nml Nml Nml Nml Nml N/A     Waveforms:

## 2019-05-05 ENCOUNTER — Ambulatory Visit: Payer: Commercial Managed Care - PPO | Admitting: Orthopedic Surgery

## 2019-05-08 ENCOUNTER — Other Ambulatory Visit: Payer: Self-pay

## 2019-05-08 ENCOUNTER — Ambulatory Visit
Admission: RE | Admit: 2019-05-08 | Discharge: 2019-05-08 | Disposition: A | Discharge status: Home / Self Care | Payer: Commercial Managed Care - PPO | Source: Ambulatory Visit | Attending: Internal Medicine | Admitting: Internal Medicine

## 2019-05-08 DIAGNOSIS — Z1231 Encounter for screening mammogram for malignant neoplasm of breast: Secondary | ICD-10-CM

## 2019-06-11 ENCOUNTER — Ambulatory Visit: Payer: Commercial Managed Care - PPO | Admitting: Orthopedic Surgery

## 2019-07-02 ENCOUNTER — Encounter: Payer: Self-pay | Admitting: Orthopedic Surgery

## 2019-07-02 ENCOUNTER — Ambulatory Visit: Payer: Commercial Managed Care - PPO | Admitting: Orthopedic Surgery

## 2019-07-02 ENCOUNTER — Other Ambulatory Visit: Payer: Self-pay

## 2019-07-02 DIAGNOSIS — G5603 Carpal tunnel syndrome, bilateral upper limbs: Secondary | ICD-10-CM | POA: Diagnosis not present

## 2019-07-06 ENCOUNTER — Encounter: Payer: Self-pay | Admitting: Orthopedic Surgery

## 2019-07-06 DIAGNOSIS — G5603 Carpal tunnel syndrome, bilateral upper limbs: Secondary | ICD-10-CM

## 2019-07-06 MED ORDER — BUPIVACAINE HCL 0.5 % IJ SOLN
1.0000 mL | INTRAMUSCULAR | Status: AC | PRN
  Administered 2019-07-06: 22:00:00 1 mL

## 2019-07-06 MED ORDER — LIDOCAINE HCL 1 % IJ SOLN
3.0000 mL | INTRAMUSCULAR | Status: AC | PRN
  Administered 2019-07-06: 22:00:00 3 mL

## 2019-07-06 MED ORDER — TRIAMCINOLONE ACETONIDE 40 MG/ML IJ SUSP
20.0000 mg | INTRAMUSCULAR | Status: AC | PRN
  Administered 2019-07-06: 22:00:00 20 mg

## 2019-07-06 NOTE — Progress Notes (Signed)
Office Visit Note   Patient: Christy Graham           Date of Birth: [birth year]           MRN: [id number] Visit Date: 07/02/2019 Requested by: Glendale Chard, DO [address] [place],  [place] [address] PCP: Garey Ham, PA-C  Subjective: Chief Complaint  Patient presents with  . Right Hand - Pain  . Left Hand - Pain    HPI: [name] is a patient with bilateral carpal tunnel syndrome.  She reports left greater than right numbness tingling and loss of dexterity in her hands.  She had a nerve conduction study completed November 5 which does show severe carpal tunnel syndrome.  She had symptoms for 5 months.  Denies any neck pain or radicular symptoms.  Affects digits 2 3 and 4.  She is right-hand dominant.  She cleans houses for living.  She is not interested in pursuing surgery unless it is the last resort.              ROS: All systems reviewed are negative as they relate to the chief complaint within the history of present illness.  Patient denies  fevers or chills.   Assessment & Plan: Visit Diagnoses:  1. Bilateral carpal tunnel syndrome     Plan: Impression is bilateral carpal tunnel syndrome left worse than right.  Plan is left carpal tunnel injection with extension braces to be worn at night.  Follow-up in 2 weeks for clinical recheck and decision for or against right carpal tunnel injection and decision for or against surgery.  Follow-Up Instructions: Return in about 2 weeks (around 07/16/2019).   Orders:  No orders of the defined types were placed in this encounter.  No orders of the defined types were placed in this encounter.     Procedures: Hand/UE Inj: L carpal tunnel for carpal tunnel syndrome on 07/06/2019 10:11 PM Details: ultrasound-guided volar approach Medications: 3 mL lidocaine 1 %; 1 mL bupivacaine 0.5 %; 20 mg triamcinolone acetonide 40 MG/ML      Clinical Data: No additional findings.  Objective: Vital  Signs: There were no vitals taken for this visit.  Physical Exam:   Constitutional: Patient appears well-developed HEENT:  Head: Normocephalic Eyes:EOM are normal Neck: Normal range of motion Cardiovascular: Normal rate Pulmonary/chest: Effort normal Neurologic: Patient is alert Skin: Skin is warm Psychiatric: Patient has normal mood and affect    Ortho Exam: Ortho exam demonstrates no abductor pollicis brevis wasting on the left and right-hand side.  Abductor pollicis brevis strength is good.  Wrist range of motion intact.  Radial pulses intact bilaterally.  Grip strength 5+ out of 5 bilateral but positive carpal tunnel compression testing is noted.  Subjective paresthesias present bilateral fingers 2 3 and 4.  Neck range of motion full.  Specialty Comments:  No specialty comments available.  Imaging: No results found.   PMFS History: Patient Active Problem List   Diagnosis Date Noted  . Pure hypercholesterolemia 03/06/2019  . Obesity (BMI 30-39.9) 03/06/2019  . Paresthesia of hand, bilateral 03/06/2019  . Elevated alkaline phosphatase level 03/06/2019  . Iron deficiency anemia due to chronic blood loss 07/16/2018  . Elevated ALT measurement 07/16/2018  . Carpal tunnel syndrome on left 07/16/2018  . Obesity (BMI 30.0-34.9) 07/16/2018   Past Medical History:  Diagnosis Date  . Anemia     Family History  Problem Relation Age of Onset  . Diabetes Mother     Past  Surgical History:  Procedure Laterality Date  . CESAREAN SECTION  05/12/2011   Procedure: CESAREAN SECTION;  Surgeon: Frederico Hamman, MD;  Location: [facility];  Service: Gynecology;  Laterality: N/A;  . EYE SURGERY Bilateral 06/06/2015   removal of ptyrigium  . TUBAL LIGATION     Social History   Occupational History  . Occupation: [employer]  Tobacco Use  . Smoking status: Never Smoker  . Smokeless tobacco: Never Used  Substance and Sexual Activity  . Alcohol use: Yes    Alcohol/week: 0.0  standard drinks    Comment: RARE  . Drug use: No  . Sexual activity: Yes    Partners: Male    Comment: 1st intercourse- 57, partners- 2, current partner- 63 yrs

## 2019-07-16 ENCOUNTER — Ambulatory Visit: Payer: Commercial Managed Care - PPO | Admitting: Orthopedic Surgery

## 2019-07-16 ENCOUNTER — Other Ambulatory Visit: Payer: Self-pay

## 2019-07-16 ENCOUNTER — Encounter: Payer: Self-pay | Admitting: Orthopedic Surgery

## 2019-07-16 DIAGNOSIS — G5603 Carpal tunnel syndrome, bilateral upper limbs: Secondary | ICD-10-CM

## 2019-07-19 NOTE — Progress Notes (Signed)
Office Visit Note   Patient: Christy Graham           Date of Birth: [birth year]           MRN: [id number] Visit Date: 07/16/2019 Requested by: Garey Ham, PA-C [address] [place],  [place] [address] PCP: Garey Ham, [place]  Subjective: Chief Complaint  Patient presents with  . Left Wrist - Follow-up    HPI: Weltha presents for follow-up of left carpal tunnel injection performed 07/02/2019.  She is also using splints.  She states she is feeling better and she does not want surgery.  No numbness in the hand at this time.              ROS: All systems reviewed are negative as they relate to the chief complaint within the history of present illness.  Patient denies  fevers or chills.   Assessment & Plan: Visit Diagnoses:  1. Bilateral carpal tunnel syndrome     Plan: Impression is bilateral carpal tunnel syndrome with very positive response to the left carpal tunnel injection under ultrasound guidance.  Continue to use splints at night.  Right hand is feeling good enough that no intervention required.  Follow-up with me as needed  Follow-Up Instructions: Return if symptoms worsen or fail to improve.   Orders:  No orders of the defined types were placed in this encounter.  No orders of the defined types were placed in this encounter.     Procedures: No procedures performed   Clinical Data: No additional findings.  Objective: Vital Signs: There were no vitals taken for this visit.  Physical Exam:   Constitutional: Patient appears well-developed HEENT:  Head: Normocephalic Eyes:EOM are normal Neck: Normal range of motion Cardiovascular: Normal rate Pulmonary/chest: Effort normal Neurologic: Patient is alert Skin: Skin is warm Psychiatric: Patient has normal mood and affect    Ortho Exam: Ortho exam demonstrates good grip strength bilaterally with no definite paresthesias on the dorsal palmar aspect of the  hand.  Radial pulses intact.  No masses lymphadenopathy or skin changes noted in that hand region.  No abductor pollicis brevis wasting.  Specialty Comments:  No specialty comments available.  Imaging: No results found.   PMFS History: Patient Active Problem List   Diagnosis Date Noted  . Pure hypercholesterolemia 03/06/2019  . Obesity (BMI 30-39.9) 03/06/2019  . Paresthesia of hand, bilateral 03/06/2019  . Elevated alkaline phosphatase level 03/06/2019  . Iron deficiency anemia due to chronic blood loss 07/16/2018  . Elevated ALT measurement 07/16/2018  . Carpal tunnel syndrome on left 07/16/2018  . Obesity (BMI 30.0-34.9) 07/16/2018   Past Medical History:  Diagnosis Date  . Anemia     Family History  Problem Relation Age of Onset  . Diabetes Mother     Past Surgical History:  Procedure Laterality Date  . CESAREAN SECTION  05/12/2011   Procedure: CESAREAN SECTION;  Surgeon: Kathreen Cosier, MD;  Location: [facility];  Service: Gynecology;  Laterality: N/A;  . EYE SURGERY Bilateral 06/06/2015   removal of ptyrigium  . TUBAL LIGATION     Social History   Occupational History  . Occupation: [employer]  Tobacco Use  . Smoking status: Never Smoker  . Smokeless tobacco: Never Used  Substance and Sexual Activity  . Alcohol use: Yes    Alcohol/week: 0.0 standard drinks    Comment: RARE  . Drug use: No  . Sexual activity: Yes    Partners: Male    Comment:  1st intercourse- 15, partners- 2, current partner- 30 yrs

## 2019-09-02 ENCOUNTER — Ambulatory Visit (HOSPITAL_COMMUNITY)
Admission: EM | Admit: 2019-09-02 | Discharge: 2019-09-02 | Disposition: A | Discharge status: Home / Self Care | Payer: Commercial Managed Care - PPO | Attending: Emergency Medicine | Admitting: Emergency Medicine

## 2019-09-02 DIAGNOSIS — L03031 Cellulitis of right toe: Secondary | ICD-10-CM

## 2019-09-02 MED ORDER — CEPHALEXIN 500 MG PO CAPS: 500.0000 mg | ORAL_CAPSULE | Freq: Four times a day (QID) | ORAL | 0 refills | Status: AC

## 2019-09-02 MED ORDER — MUPIROCIN 2 % EX OINT: 1.0000 "application " | TOPICAL_OINTMENT | Freq: Two times a day (BID) | CUTANEOUS | 0 refills | Status: DC

## 2019-09-02 NOTE — ED Triage Notes (Signed)
Pt c/o ingrown toenail with pain for approx 5-6 months. Pt states area now draining white to yellow fluid and concerned for possible infection. Right great toe edematous.

## 2019-09-02 NOTE — Discharge Instructions (Signed)
Begin keflex 4 times daily for the next week Soak foot in warm water and dry well, apply antibiotic ointment If you continue to have pain after infection resolved may return to trim ingrown nail Please read attached information

## 2019-09-02 NOTE — ED Provider Notes (Signed)
[facility]    CSN: [id number] Arrival date & time: 09/02/19  1108      History   Chief Complaint Chief Complaint  Patient presents with  . Ingrown Toenail    HPI Christy Graham is a 39 y.o. female presenting today for evaluation of toe pain and swelling.  Patient notes that she has had an ingrown toenail for approximately 5 to 6 months.  Over the past 3 days she has developed increased pain, swelling redness as well as pustular drainage around nail bed.  Denies fever.   HPI  Past Medical History:  Diagnosis Date  . Anemia     Patient Active Problem List   Diagnosis Date Noted  . Pure hypercholesterolemia 03/06/2019  . Obesity (BMI 30-39.9) 03/06/2019  . Paresthesia of hand, bilateral 03/06/2019  . Elevated alkaline phosphatase level 03/06/2019  . Iron deficiency anemia due to chronic blood loss 07/16/2018  . Elevated ALT measurement 07/16/2018  . Carpal tunnel syndrome on left 07/16/2018  . Obesity (BMI 30.0-34.9) 07/16/2018    Past Surgical History:  Procedure Laterality Date  . CESAREAN SECTION  05/12/2011   Procedure: CESAREAN SECTION;  Surgeon: Kathreen Cosier, MD;  Location: [facility];  Service: Gynecology;  Laterality: N/A;  . EYE SURGERY Bilateral 06/06/2015   removal of ptyrigium  . TUBAL LIGATION      OB History    Gravida  5   Para  4   Term  4   Preterm      AB  1   Living  4     SAB  1   TAB      Ectopic      Multiple      Live Births  4            Home Medications    Prior to Admission medications   Medication Sig Start Date End Date Taking? Authorizing Provider  cephALEXin (KEFLEX) 500 MG capsule Take 1 capsule (500 mg total) by mouth 4 (four) times daily for 7 days. 09/02/19 09/09/19  Macky Galik C, PA-C  ferrous sulfate 325 (65 FE) MG tablet Take 325 mg by mouth 3 (three) times daily. Increased on 07/17/18    [provider]  mupirocin ointment (BACTROBAN) 2 % Apply 1 application topically 2  (two) times daily. 09/02/19   Myquan Schaumburg C, PA-C  vitamin B-12 (CYANOCOBALAMIN) 100 MCG tablet Take 100 mcg by mouth 2 (two) times daily.    [provider]  gabapentin (NEURONTIN) 300 MG capsule Take 1 tablet at bedtime x 1 week, then increase to 1 tablet in morning and bedtime. 02/28/19 09/02/19  Glendale Chard, DO    Family History Family History  Problem Relation Age of Onset  . Diabetes Mother     Social History Social History   Tobacco Use  . Smoking status: Never Smoker  . Smokeless tobacco: Never Used  Substance Use Topics  . Alcohol use: Yes    Alcohol/week: 0.0 standard drinks    Comment: RARE  . Drug use: No     Allergies   Patient has no known allergies.   Review of Systems Review of Systems  Constitutional: Negative for fatigue and fever.  Eyes: Negative for visual disturbance.  Respiratory: Negative for shortness of breath.   Cardiovascular: Negative for chest pain.  Gastrointestinal: Negative for abdominal pain, nausea and vomiting.  Musculoskeletal: Positive for joint swelling. Negative for arthralgias.  Skin: Positive for color change. Negative for rash and  wound.  Neurological: Negative for dizziness, weakness, light-headedness and headaches.     Physical Exam Triage Vital Signs ED Triage Vitals  Enc Vitals Group     BP 09/02/19 1210 127/88     Pulse Rate 09/02/19 1210 83     Resp 09/02/19 1210 16     Temp 09/02/19 1210 98.1 F (36.7 C)     Temp Source 09/02/19 1210 Oral     SpO2 09/02/19 1210 98 %     Weight --      Height --      Head Circumference --      Peak Flow --      Pain Score 09/02/19 1208 6     Pain Loc --      Pain Edu? --      Excl. in GC? --    No data found.  Updated Vital Signs BP 127/88 (BP Location: Right Arm)   Pulse 83   Temp 98.1 F (36.7 C) (Oral)   Resp 16   LMP 08/26/2019   SpO2 98%   Visual Acuity Right Eye Distance:   Left Eye Distance:   Bilateral Distance:    Right Eye Near:     Left Eye Near:    Bilateral Near:     Physical Exam Vitals and nursing note reviewed.  Constitutional:      Appearance: She is well-developed.     Comments: No acute distress  HENT:     Head: Normocephalic and atraumatic.     Nose: Nose normal.  Eyes:     Conjunctiva/sclera: Conjunctivae normal.  Cardiovascular:     Rate and Rhythm: Normal rate.  Pulmonary:     Effort: Pulmonary effort is normal. No respiratory distress.  Abdominal:     General: There is no distension.  Musculoskeletal:        General: Normal range of motion.     Cervical back: Neck supple.     Comments: Dorsalis pedis 2+  Skin:    General: Skin is warm and dry.     Comments: Right great toe with erythema swelling and skin peeling noted to lateral nail fold and over proximal nail fold, dried pustular material noted within nail fold, tender to palpation proximally and laterally around nail, nontender to distal toe on plantar surface   Neurological:     Mental Status: She is alert and oriented to person, place, and time.      UC Treatments / Results  Labs (all labs ordered are listed, but only abnormal results are displayed) Labs Reviewed - No data to display  EKG   Radiology No results found.  Procedures Procedures (including critical care time)  Medications Ordered in UC Medications - No data to display  Initial Impression / Assessment and Plan / UC Course  I have reviewed the triage vital signs and the nursing notes.  Pertinent labs & imaging results that were available during my care of the patient were reviewed by me and considered in my medical decision making (see chart for details).     Paronychia to right great toe, possibly secondary to ingrown toenail.  Recommended treatment of paronychia today with Keflex and Bactroban, after infection resolved if continuing to have discomfort from ingrown nail may return for wedge excision.  Recommended against this today.  Discussed strict  return precautions. Patient verbalized understanding and is agreeable with plan.  Final Clinical Impressions(s) / UC Diagnoses   Final diagnoses:  Paronychia of great toe of right foot  Discharge Instructions     Begin keflex 4 times daily for the next week Soak foot in warm water and dry well, apply antibiotic ointment If you continue to have pain after infection resolved may return to trim ingrown nail Please read attached information   ED Prescriptions    Medication Sig Dispense Auth. Provider   cephALEXin (KEFLEX) 500 MG capsule Take 1 capsule (500 mg total) by mouth 4 (four) times daily for 7 days. 28 capsule Greysyn Vanderberg C, PA-C   mupirocin ointment (BACTROBAN) 2 % Apply 1 application topically 2 (two) times daily. 30 g Alex Mcmanigal, [place] C, PA-C     PDMP not reviewed this encounter.   Joneen Caraway Victoria C, PA-C 09/02/19 1344

## 2019-09-04 ENCOUNTER — Ambulatory Visit: Payer: Commercial Managed Care - PPO | Admitting: Internal Medicine

## 2019-09-04 ENCOUNTER — Other Ambulatory Visit: Payer: Self-pay

## 2019-09-04 ENCOUNTER — Encounter: Payer: Self-pay | Admitting: Internal Medicine

## 2019-09-04 VITALS — BP 120/78 | HR 83 | Temp 98.4°F | Ht 60.6 in | Wt 177.8 lb

## 2019-09-04 DIAGNOSIS — Z6834 Body mass index (BMI) 34.0-34.9, adult: Secondary | ICD-10-CM

## 2019-09-04 DIAGNOSIS — E781 Pure hyperglyceridemia: Secondary | ICD-10-CM | POA: Diagnosis not present

## 2019-09-04 DIAGNOSIS — E669 Obesity, unspecified: Secondary | ICD-10-CM

## 2019-09-04 DIAGNOSIS — G5602 Carpal tunnel syndrome, left upper limb: Secondary | ICD-10-CM | POA: Diagnosis not present

## 2019-09-04 DIAGNOSIS — R748 Abnormal levels of other serum enzymes: Secondary | ICD-10-CM

## 2019-09-04 MED ORDER — PHENTERMINE HCL 37.5 MG PO CAPS: ORAL_CAPSULE | ORAL | 0 refills | Status: DC

## 2019-09-04 NOTE — Progress Notes (Signed)
This visit occurred during the SARS-CoV-2 public health emergency.  Safety protocols were in place, including screening questions prior to the visit, additional usage of staff PPE, and extensive cleaning of exam room while observing appropriate contact time as indicated for disinfecting solutions.  Subjective:     Patient ID: Christy Graham , female    DOB: January 26, 1981 , 39 y.o.   MRN: [id number]   Chief Complaint  Patient presents with  . Weight Check    lipid check, f/u on pain in hand    HPI  1- Here for lipid panel repeat, has been eating more vegetables and less carbs.   2- FU hand pain. Has seen neurologist and was given steroid injection in L wrist and been wearing wrist splints and she is 80 % better.   3- Was in urgent care this weekend for paronychia treatment of R great toe and she is getting much better 4- Wt check. Has not been exercising outside all the walking she does at work. She has not eating any bread and rarely eats tortillas.   Past Medical History:  Diagnosis Date  . Anemia      Family History  Problem Relation Age of Onset  . Diabetes Mother      Current Outpatient Medications:  .  cephALEXin (KEFLEX) 500 MG capsule, Take 1 capsule (500 mg total) by mouth 4 (four) times daily for 7 days., Disp: 28 capsule, Rfl: 0 .  mupirocin ointment (BACTROBAN) 2 %, Apply 1 application topically 2 (two) times daily., Disp: 30 g, Rfl: 0 .  vitamin B-12 (CYANOCOBALAMIN) 100 MCG tablet, Take 100 mcg by mouth 2 (two) times daily., Disp: , Rfl:  .  ferrous sulfate 325 (65 FE) MG tablet, Take 325 mg by mouth 3 (three) times daily. Increased on 07/17/18, Disp: , Rfl:    No Known Allergies   Review of Systems   Today's Vitals   09/04/19 0836  BP: 120/78  Pulse: 83  Temp: 98.4 F (36.9 C)  TempSrc: Oral  SpO2: 96%  Weight: 177 lb 12.8 oz (80.6 kg)  Height: 5' 0.6" (1.539 m)   Body mass index is 34.04 kg/m.   Objective:  Physical Exam Vitals reviewed.   Constitutional:      General: She is not in acute distress.    Appearance: She is obese. She is not toxic-appearing.  HENT:     Head: Normocephalic.     Right Ear: External ear normal.     Left Ear: External ear normal.     Nose: Nose normal.  Eyes:     General: No scleral icterus.    Conjunctiva/sclera: Conjunctivae normal.  Cardiovascular:     Rate and Rhythm: Normal rate and regular rhythm.  Pulmonary:     Effort: Pulmonary effort is normal.     Breath sounds: Normal breath sounds.  Musculoskeletal:        General: Normal range of motion.     Cervical back: Neck supple. No tenderness.  Lymphadenopathy:     Cervical: No cervical adenopathy.  Skin:    General: Skin is warm and dry.  Neurological:     Mental Status: She is alert and oriented to person, place, and time.  Psychiatric:        Mood and Affect: Mood normal.        Behavior: Behavior normal.        Thought Content: Thought content normal.        Judgment: Judgment normal.  Assessment And Plan:   1. Obesity (BMI 30.0-34.9)- wt is up 7 lbs. I offered her Phentermine to help her loose wt and she is willing to try this. I reviewed common side effects to watch out for. Fu in 1 month for wt check.   2. Carpal tunnel syndrome on left- improved. Will continue care with Neurologist.  3. High triglycerides- unknown status. If still elevated I will need to place her on Vascepa.  - Lipid panel 4. Elevated alkaline phosphatase level- She never came back to have this repeated, so we will do this today.  - Alkaline Phosphatase, Isoenzymes    Breleigh Carpino RODRIGUEZ-SOUTHWORTH, PA-C    THE PATIENT IS ENCOURAGED TO PRACTICE SOCIAL DISTANCING DUE TO THE COVID-19 PANDEMIC.

## 2019-09-10 LAB — ALKALINE PHOSPHATASE, ISOENZYMES
Alkaline Phosphatase: 114 IU/L (ref 39–117)
BONE FRACTION: 34 % (ref 14–68)
INTESTINAL FRAC.: 0 % (ref 0–18)
LIVER FRACTION: 66 % (ref 18–85)

## 2019-09-10 LAB — LIPID PANEL
Chol/HDL Ratio: 3.7 ratio (ref 0.0–4.4)
Cholesterol, Total: 183 mg/dL (ref 100–199)
HDL: 50 mg/dL (ref >39)
LDL Chol Calc (NIH): 108 mg/dL — ABNORMAL HIGH (ref 0–99)
Triglycerides: 142 mg/dL (ref 0–149)
VLDL Cholesterol Cal: 25 mg/dL (ref 5–40)

## 2019-09-17 ENCOUNTER — Ambulatory Visit: Payer: Commercial Managed Care - PPO | Admitting: Obstetrics & Gynecology

## 2019-09-17 ENCOUNTER — Other Ambulatory Visit: Payer: Self-pay

## 2019-09-17 ENCOUNTER — Encounter: Payer: Self-pay | Admitting: Obstetrics & Gynecology

## 2019-09-17 VITALS — BP 130/80

## 2019-09-17 DIAGNOSIS — N9089 Other specified noninflammatory disorders of vulva and perineum: Secondary | ICD-10-CM | POA: Diagnosis not present

## 2019-09-17 DIAGNOSIS — N898 Other specified noninflammatory disorders of vagina: Secondary | ICD-10-CM

## 2019-09-17 LAB — WET PREP FOR TRICH, YEAST, CLUE

## 2019-09-17 MED ORDER — CLOBETASOL PROPIONATE 0.05 % EX CREA: 1.0000 "application " | TOPICAL_CREAM | Freq: Every day | CUTANEOUS | 0 refills | Status: AC

## 2019-09-17 MED ORDER — FLUCONAZOLE 150 MG PO TABS: 150.0000 mg | ORAL_TABLET | Freq: Every day | ORAL | 1 refills | Status: AC

## 2019-09-17 NOTE — Progress Notes (Signed)
    Christy Graham 1981/02/13 [id number]        40 y.o.  [id number] Married.  S/P TL.  RP: Vaginal and vulvar irritation and itching x 3 days.  HPI: Menses regular normal every month.  LMP 08/26/2019.  Had IC 3 days ago and since then, both herself and her husband feel irritated and itchy.  Patient feels vaginal irritation as well as vulvar.  No vaginal d/c noted.  No pelvic pain.  No BTB.  No fever.  Tried an Anti-Fungal OTC cream x 2 days without resolution of symptoms.   OB History  Gravida Para Term Preterm AB Living  5 4 4   1 4   SAB TAB Ectopic Multiple Live Births  1       4    # Outcome Date GA Lbr Len/2nd Weight Sex Delivery Anes PTL Lv  5 Term 05/12/11 [redacted]w[redacted]d  5 lb 9.8 oz (2.545 kg) F CS-LTranv Spinal  LIV  4 SAB 07/06/05 [redacted]w[redacted]d   F  None  DEC  3 Term 02/19/03 [redacted]w[redacted]d   M Vag-Spont None N LIV  2 Term 03/17/99 [redacted]w[redacted]d   F Vag-Spont None N LIV  1 Term 02/18/86 [redacted]w[redacted]d   F Vag-Spont None N LIV    Past medical history,surgical history, problem list, medications, allergies, family history and social history were all reviewed and documented in the EPIC chart.   Directed ROS with pertinent positives and negatives documented in the history of present illness/assessment and plan.  Exam:  Vitals:   09/17/19 1540  BP: 130/80   General appearance:  Normal   Gynecologic exam: Vulva: Erythema/irritation.  No discrete lesion, no atrophy.  Speculum:  Cervix/Vagina normal.  Normal secretions.  Wet prep done.  Wet prep Negative   Assessment/Plan:  39 y.o. 41   1. Vaginal pruritus Probable yeast vaginitis, but patient self treated with over-the-counter cream for 2 days and wet prep is negative now.  Will give a treatment of fluconazole to make sure it is completely resolved.  Usage reviewed and prescription sent to pharmacy. - WET PREP FOR TRICH, YEAST, CLUE  2. Vulvar irritation Vulvar inflammation and irritation probably post yeast vaginitis.  Recommend applying a thin layer  of clobetasol cream once daily for 7 days.  Usage reviewed and prescription sent to pharmacy.  Other orders - fluconazole (DIFLUCAN) 150 MG tablet; Take 1 tablet (150 mg total) by mouth daily for 3 days. - clobetasol cream (TEMOVATE) 0.05 %; Apply 1 application topically daily for 7 days. Thin vulvar application  [id number] MD, 3:45 PM 09/17/2019

## 2019-09-17 NOTE — Patient Instructions (Signed)
1. Vaginal pruritus Probable yeast vaginitis, but patient self treated with over-the-counter cream for 2 days and wet prep is negative now.  Will give a treatment of fluconazole to make sure it is completely resolved.  Usage reviewed and prescription sent to pharmacy. - WET PREP FOR TRICH, YEAST, CLUE  2. Vulvar irritation Vulvar inflammation and irritation probably post yeast vaginitis.  Recommend applying a thin layer of clobetasol cream once daily for 7 days.  Usage reviewed and prescription sent to pharmacy.  Other orders - fluconazole (DIFLUCAN) 150 MG tablet; Take 1 tablet (150 mg total) by mouth daily for 3 days. - clobetasol cream (TEMOVATE) 0.05 %; Apply 1 application topically daily for 7 days. Thin vulvar application  Ryleah, it was a pleasure seeing you today!

## 2019-10-02 ENCOUNTER — Ambulatory Visit: Payer: Commercial Managed Care - PPO | Admitting: Internal Medicine

## 2019-10-02 ENCOUNTER — Encounter: Payer: Self-pay | Admitting: Internal Medicine

## 2019-10-02 ENCOUNTER — Other Ambulatory Visit: Payer: Self-pay

## 2019-10-02 VITALS — BP 118/76 | HR 85 | Temp 98.1°F | Ht 60.4 in | Wt 176.0 lb

## 2019-10-02 DIAGNOSIS — E669 Obesity, unspecified: Secondary | ICD-10-CM | POA: Diagnosis not present

## 2019-10-02 DIAGNOSIS — Z6833 Body mass index (BMI) 33.0-33.9, adult: Secondary | ICD-10-CM | POA: Diagnosis not present

## 2019-10-02 DIAGNOSIS — T7840XA Allergy, unspecified, initial encounter: Secondary | ICD-10-CM

## 2019-10-02 MED ORDER — PHENTERMINE HCL 37.5 MG PO CAPS: ORAL_CAPSULE | ORAL | 0 refills | Status: DC

## 2019-10-02 NOTE — Patient Instructions (Signed)
Tome Claritin( loratidine) 10 mg una al dia y Benadryl 25 mg en la noche si necesitas mas ayuda  Alergia a los [employer] Drug Allergy La alergia a los medicamentos ocurre cuando el organismo reacciona mal a Ecologist. La reaccin puede ser leve o muy grave. En algunos casos, puede ser potencialmente mortal. Si usted tiene una reaccin alrgica, obtenga ayuda de inmediato. Debe obtener ayuda aunque la reaccin parezca leve. Cules son las causas? La causa de esta afeccin es una reaccin en el sistema de defensa del cuerpo (sistema inmunitario). El sistema considera que un medicamento es nocivo cuando en realidad no lo es. Cules son los signos o los sntomas? Sntomas de una reaccin leve  Congestin nasal.  Hormigueo en la boca.  Erupcin cutnea de color rojo que causa picazn. Sntomas de [place] reaccin muy grave  Hinchazn de los ojos, labios, rostro o [place].  Hinchazn de la parte posterior de la boca y de la garganta.  Respiracin ruidosa (sibilancias).  Voz ronca.  Zonas de piel hinchadas, rojas y que producen picazn (ronchas).  Sentirse mareado o aturdido.  Desmayos.  Estar preocupado o nervioso (ansioso).  Sentirse confundido (confuso).  Dolor en el vientre (abdomen).  Tiene problemas para respirar, hablar o tragar.  Sensacin de opresin en el pecho.  Latidos cardacos acelerados o irregulares (palpitaciones).  Vmitos.  Materia fecal lquida (diarrea). Cmo se trata? No hay cura para las alergias. Una reaccin alrgica se puede tratar con lo siguiente:  Medicamentos para aliviar los sntomas.  Medicamentos que debe inhalar para que lleguen a los pulmones (inhaladores respiratorios).  Una inyeccin para la alergia (inyeccin de epinefrina). En el caso de una reaccin muy grave, es posible que [facility]ist [facility]. Su mdico le podr ensear cmo usar un equipo de emergencia para la Buyer, retail (kit de anafilaxia) y cmo aplicarse una  Automotive engineer. Usted puede aplicarse una vacuna contra la alergia con lo que se conoce como "lpiz" Public house manager. Siga estas indicaciones en su casa: Si tiene una alergia muy grave:   Siempre lleve consigo su lpiz autoinyector o kit para Forensic scientist. Esto puede salvar su vida. Utilcelo como se lo haya indicado el mdico.  Asegrese de que usted, las personas que viven con usted y su empleador sepan: ? Cmo usar el kit para Forensic scientist. ? Cmo usar un lpiz autoinyector para aplicarle Runner, broadcasting/film/video.  Si usted [place] su lpiz autoinyector: ? Consiga ms medicamento de inmediato. Esto es importante en caso de que tenga otra reaccin alrgica. ? Solicite ayuda de inmediato.  Use un brazalete o un collar de alerta mdico que indique que es alrgico, si su mdico se lo indica. Indicaciones generales  Evite los medicamentos a los cuales es alrgico.  [employer] medicamentos de venta libre y los recetados solamente como se lo haya indicado el mdico.  No conduzca hasta que el mdico le diga que es seguro [place].  Si tiene zonas de la piel hinchadas, rojas y que pican o si tiene una erupcin: ? Use un medicamento de venta libre (antihistamnico) como se lo haya indicado el mdico. ? Pngase paos fros y hmedos (compresas fras) sobre la piel. ? Progreso de agua fra. Evite el agua caliente.  Si le Therapist, music, usted deber [employer] . Pregntele al mdico la fecha en que los resultados estarn disponibles.  Informe a todos los mdicos que lo atienden que usted tiene [place] a Ecologist.  Concurra a [place]  de seguimiento como se lo haya indicado el mdico. Esto es importante. Comunquese con un mdico si:  Piensa que tiene Nurse, mental health leve.  Tiene sntomas que duran ms de 2 das despus de la reaccin.  Sus sntomas empeoran.  Tiene sntomas nuevos. Solicite ayuda inmediatamente si:  Tuvo que [place]. Debe ir a una sala de emergencias, incluso si el medicamento parece estar surtiendo [employer].  Tiene sntomas de Nurse, mental health muy grave. Estos sntomas pueden Sales executive. No espere a ver si los sntomas desaparecen. Use el lpiz autoinyector o el kit para la alergia como se lo hayan indicado. Solicite atencin mdica de inmediato. Comunquese con el servicio de emergencias de su localidad (911 en los [place]). No conduzca por sus propios medios [employer]. Resumen  La alergia a los medicamentos ocurre cuando el organismo reacciona mal a un medicamento.  Tome los medicamentos solamente como se lo haya indicado el mdico.  Informe a todos los mdicos que lo atienden que usted tiene una alergia a Ecologist.  Siempre lleve consigo su lpiz autoinyector o kit para la alergia si tiene Ardelia Mems alergia muy grave. Esta informacin no tiene Marine scientist el consejo del mdico. Asegrese de hacerle al mdico cualquier pregunta que tenga. Document Revised: 01/23/2018 Document Reviewed: 01/23/2018 Elsevier Patient Education  2020 [employer].

## 2019-10-02 NOTE — Progress Notes (Signed)
This visit occurred during the SARS-CoV-2 public health emergency.  Safety protocols were in place, including screening questions prior to the visit, additional usage of staff PPE, and extensive cleaning of exam room while observing appropriate contact time as indicated for disinfecting solutions.  Subjective:     Patient ID: Christy Graham , female    DOB: [birth year] , 39 y.o.   MRN: [id number]   Chief Complaint  Patient presents with  . Weight Check    rash on arms and legs     HPI  Is here for FU trial of phentermine for wt loss. She has been tolerating this fine for 1st 2 weeks, but then saw her GYN for vaginal infection on 4/14 and was prescribed diflucan and 3 days later developed an itchy rash all over. So she stopped taking the Phentermine. She denies trouble swallowing, breathing, CP or SOB. She has stopped eating tortillas.   Past Medical History:  Diagnosis Date  . Anemia      Family History  Problem Relation Age of Onset  . Diabetes Mother      Current Outpatient Medications:  .  ferrous sulfate 325 (65 FE) MG tablet, Take 325 mg by mouth 3 (three) times daily. Increased on 07/17/18, Disp: , Rfl:  .  mupirocin ointment (BACTROBAN) 2 %, Apply 1 application topically 2 (two) times daily., Disp: 30 g, Rfl: 0 .  vitamin B-12 (CYANOCOBALAMIN) 100 MCG tablet, Take 100 mcg by mouth 2 (two) times daily., Disp: , Rfl:  .  phentermine 37.5 MG capsule, 1/2 - 1 30 minutes before meals for wt loss (Patient not taking: Reported on 10/02/2019), Disp: 30 capsule, Rfl: 0   No Known Allergies   Review of Systems  + itchy rash, the rest of 10 point ROS is neg.  Today's Vitals   10/02/19 0908  Temp: 98.1 F (36.7 C)  TempSrc: Oral  Weight: 176 lb (79.8 kg)  Height: 5' 0.4" (1.534 m)   Body mass index is 33.92 kg/m.   Objective:  Physical Exam Vitals and nursing note reviewed.  Constitutional:      General: She is not in acute distress.    Appearance: She is obese.  She is not toxic-appearing.  HENT:     Right Ear: External ear normal.     Left Ear: External ear normal.  Eyes:     General: No scleral icterus.    Conjunctiva/sclera: Conjunctivae normal.  Cardiovascular:     Rate and Rhythm: Normal rate and regular rhythm.     Heart sounds: No murmur.  Pulmonary:     Effort: Pulmonary effort is normal.     Breath sounds: Normal breath sounds.  Musculoskeletal:        General: Normal range of motion.     Cervical back: Normal range of motion and neck supple.  Skin:    General: Skin is warm and dry.     Findings: Rash present.     Comments: Has scattered hives all over the body of light pink coloration  Neurological:     Mental Status: She is alert and oriented to person, place, and time.     Gait: Gait normal.  Psychiatric:        Mood and Affect: Mood normal.        Behavior: Behavior normal.        Thought Content: Thought content normal.        Judgment: Judgment normal.     Wt is down 1 lbs  since last visit    Assessment And Plan:    1. Obesity (BMI 30-39.9)- slowly improving. I refilled her Phentermine since I dont believe this is the cause of her rash.  2. Allergic reaction, initial encounter to Diflucan.- she was advised to take Claritin 10 mg qd, and may add benadryl qhs if she needs to.    Rashanda Magloire RODRIGUEZ-SOUTHWORTH, PA-C    THE PATIENT IS ENCOURAGED TO PRACTICE SOCIAL DISTANCING DUE TO THE COVID-19 PANDEMIC.

## 2019-11-06 ENCOUNTER — Other Ambulatory Visit: Payer: Self-pay

## 2019-11-06 ENCOUNTER — Ambulatory Visit: Payer: Commercial Managed Care - PPO | Admitting: Internal Medicine

## 2019-11-06 ENCOUNTER — Encounter: Payer: Self-pay | Admitting: Internal Medicine

## 2019-11-06 VITALS — BP 120/74 | HR 82 | Temp 98.4°F | Ht 60.2 in | Wt 174.6 lb

## 2019-11-06 DIAGNOSIS — T7840XA Allergy, unspecified, initial encounter: Secondary | ICD-10-CM

## 2019-11-06 DIAGNOSIS — E669 Obesity, unspecified: Secondary | ICD-10-CM | POA: Diagnosis not present

## 2019-11-06 DIAGNOSIS — Z23 Encounter for immunization: Secondary | ICD-10-CM | POA: Diagnosis not present

## 2019-11-06 NOTE — Patient Instructions (Addendum)
Trata de comer fruta con menos fruta: strawberries, blue berries, balck berries. Come las otras frutas solo 2 veces a Lawyer.  Mi correo es: [email or web address] o                       [email or web address]

## 2019-11-06 NOTE — Progress Notes (Signed)
This visit occurred during the SARS-CoV-2 public health emergency.  Safety protocols were in place, including screening questions prior to the visit, additional usage of staff PPE, and extensive cleaning of exam room while observing appropriate contact time as indicated for disinfecting solutions.  Subjective:     Patient ID: Christy Graham , female    DOB: 09-Dec-1980 , 39 y.o.   MRN: [id number]   Chief Complaint  Patient presents with  . Weight Loss    HPI Here for FU wt check and been on low carb diet.The Phentermine has been causing rash similar as to when she had a reaction to diflucan.   She only has been walking for 10 minutes a few times a week. Has been eating fruit like a banana a day or a green apple. She does not keep up with her calories, but believes she needs to reduce her portions more since off the Phentermine.   Past Medical History:  Diagnosis Date  . Anemia      Family History  Problem Relation Age of Onset  . Diabetes Mother      Current Outpatient Medications:  .  ferrous sulfate 325 (65 FE) MG tablet, Take 325 mg by mouth 3 (three) times daily. Increased on 07/17/18, Disp: , Rfl:  .  phentermine 37.5 MG capsule, One  30 minutes before meals for wt loss, Disp: 30 capsule, Rfl: 0 .  vitamin B-12 (CYANOCOBALAMIN) 100 MCG tablet, Take 100 mcg by mouth 2 (two) times daily., Disp: , Rfl:  .  mupirocin ointment (BACTROBAN) 2 %, Apply 1 application topically 2 (two) times daily. (Patient not taking: Reported on 11/06/2019), Disp: 30 g, Rfl: 0   Allergies  Allergen Reactions  . Diflucan [Fluconazole] Itching and Rash     Review of Systems  Denies trouble swallowing, breathing, wheezing, itching skin, and currently does not have a rash.  Today's Vitals   11/06/19 0839  BP: 120/74  Pulse: 82  Temp: 98.4 F (36.9 C)  TempSrc: Oral  SpO2: 96%  Weight: 174 lb 9.6 oz (79.2 kg)  Height: 5' 0.2" (1.529 m)   Body mass index is 33.87 kg/m.   Objective:   Physical Exam  Wt is down 2 lbs since April.  Constitutional: She is oriented to person, place, and time. She appears well-developed and well-nourished. No distress.  HENT:  Head: Normocephalic and atraumatic.  Right Ear: External ear normal.  Left Ear: External ear normal.  Nose: Nose normal.  Eyes: Conjunctivae are normal. Right eye exhibits no discharge. Left eye exhibits no discharge. No scleral icterus.  Neck: Neck supple. No thyromegaly present.  No carotid bruits bilaterally  Cardiovascular: Normal rate and regular rhythm.  No murmur heard. Pulmonary/Chest: Effort normal and breath sounds normal. No respiratory distress.  Musculoskeletal: Normal range of motion. She exhibits no edema.  Lymphadenopathy:    She has no cervical adenopathy.  Neurological: She is alert and oriented to person, place, and time.  Skin: Skin is warm and dry. Capillary refill takes less than 2 seconds. No rash noted. She is not diaphoretic.  Psychiatric: She has a normal mood and affect. Her behavior is normal. Judgment and thought content normal.  Nursing note reviewed.     Assessment And Plan:   1. Obesity (BMI 30.0-34.9)- slowly loosing wt. Needs to continue low carbs, but eat more low glycemic fruits instead of the ones she has been eating.   2. Allergic reaction, initial encounter- to Phentermine. This was discontinued.   FU  in 4-6 weeks with Janece to continue weight loss program.      Kitai Purdom RODRIGUEZ-SOUTHWORTH, PA-C    THE PATIENT IS ENCOURAGED TO PRACTICE SOCIAL DISTANCING DUE TO THE COVID-19 PANDEMIC.

## 2020-01-16 ENCOUNTER — Encounter: Payer: Commercial Managed Care - PPO | Admitting: Obstetrics & Gynecology

## 2020-01-20 ENCOUNTER — Encounter: Payer: Commercial Managed Care - PPO | Admitting: Obstetrics & Gynecology

## 2020-01-20 ENCOUNTER — Ambulatory Visit: Payer: Commercial Managed Care - PPO | Admitting: Obstetrics & Gynecology

## 2020-01-20 ENCOUNTER — Other Ambulatory Visit: Payer: Self-pay

## 2020-01-20 ENCOUNTER — Encounter: Payer: Self-pay | Admitting: Obstetrics & Gynecology

## 2020-01-20 VITALS — BP 122/80 | Ht 60.5 in | Wt 175.0 lb

## 2020-01-20 DIAGNOSIS — Z01419 Encounter for gynecological examination (general) (routine) without abnormal findings: Secondary | ICD-10-CM

## 2020-01-20 DIAGNOSIS — E6609 Other obesity due to excess calories: Secondary | ICD-10-CM | POA: Diagnosis not present

## 2020-01-20 DIAGNOSIS — Z9851 Tubal ligation status: Secondary | ICD-10-CM

## 2020-01-20 DIAGNOSIS — Z6833 Body mass index (BMI) 33.0-33.9, adult: Secondary | ICD-10-CM

## 2020-01-20 NOTE — Progress Notes (Signed)
Christy Graham 27-Nov-1980 [id number]   History:    39 y.o. [id number] Married x 16 years. S/P TL. Children  17 daughter, 17 daughter, 76 son and 30 yo daughter.  LP:[id number] presenting for annual gyn exam   AST:[id number] post tubal ligation. Menses regular every month with normal flow.  No BTB. Patient taking vitamins with iron. No pelvic pain. No pain with intercourse. History of abnormal Pap about 11 years ago. Had a colposcopy with biopsies but no treatment necessary per patient. Normal vaginal secretions.  Urine normal. Bowel movements normal. Breasts normal.  BMI 33.61.  Walking. Health labs with family physician.   Past medical history,surgical history, family history and social history were all reviewed and documented in the EPIC chart.  Gynecologic History Patient's last menstrual period was 12/25/2019.  Obstetric History OB History  Gravida Para Term Preterm AB Living  5 4 4   1 4   SAB TAB Ectopic Multiple Live Births  1       4    # Outcome Date GA Lbr Len/2nd Weight Sex Delivery Anes PTL Lv  5 Term 05/12/11 [redacted]w[redacted]d  5 lb 9.8 oz (2.545 kg) F CS-LTranv Spinal  LIV  4 SAB 07/06/05 [redacted]w[redacted]d   F  None  DEC  3 Term 02/19/03 [redacted]w[redacted]d   M Vag-Spont None N LIV  2 Term 03/17/99 [redacted]w[redacted]d   F Vag-Spont None N LIV  1 Term 02/18/86 [redacted]w[redacted]d   F Vag-Spont None N LIV     ROS: A ROS was performed and pertinent positives and negatives are included in the history.  GENERAL: No fevers or chills. HEENT: No change in vision, no earache, sore throat or sinus congestion. NECK: No pain or stiffness. CARDIOVASCULAR: No chest pain or pressure. No palpitations. PULMONARY: No shortness of breath, cough or wheeze. GASTROINTESTINAL: No abdominal pain, nausea, vomiting or diarrhea, melena or bright red blood per rectum. GENITOURINARY: No urinary frequency, urgency, hesitancy or dysuria. MUSCULOSKELETAL: No joint or muscle pain, no back pain, no recent trauma. DERMATOLOGIC: No rash, no  itching, no lesions. ENDOCRINE: No polyuria, polydipsia, no heat or cold intolerance. No recent change in weight. HEMATOLOGICAL: No anemia or easy bruising or bleeding. NEUROLOGIC: No headache, seizures, numbness, tingling or weakness. PSYCHIATRIC: No depression, no loss of interest in normal activity or change in sleep pattern.     Exam:   BP 122/80   Ht 5' 0.5" (1.537 m)   Wt 175 lb (79.4 kg)   LMP 12/25/2019   BMI 33.61 kg/m   Body mass index is 33.61 kg/m.  General appearance : Well developed well nourished female. No acute distress HEENT: Eyes: no retinal hemorrhage or exudates,  Neck supple, trachea midline, no carotid bruits, no thyroidmegaly Lungs: Clear to auscultation, no rhonchi or wheezes, or rib retractions  Heart: Regular rate and rhythm, no murmurs or gallops Breast:Examined in sitting and supine position were symmetrical in appearance, no palpable masses or tenderness,  no skin retraction, no nipple inversion, no nipple discharge, no skin discoloration, no axillary or supraclavicular lymphadenopathy Abdomen: no palpable masses or tenderness, no rebound or guarding Extremities: no edema or skin discoloration or tenderness  Pelvic: Vulva: Normal             Vagina: No gross lesions or discharge  Cervix: No gross lesions or discharge.  Pap reflex done.  Uterus  AV, normal size, shape and consistency, non-tender and mobile  Adnexa  Without masses or tenderness  Anus: Normal   Assessment/Plan:  38  y.o. female for annual exam   1. Encounter for routine gynecological examination with Papanicolaou smear of cervix Normal gynecologic exam.  Pap reflex done.  Breast exam normal.  Screening mammogram December 2020 was negative.  Health labs with family physician.  2. S/P tubal ligation  3. Class 1 obesity due to excess calories without serious comorbidity with body mass index (BMI) of 33.0 to 33.9 in adult Recommend a lower calorie/carb diet.  Aerobic activities 5 times a  week and light weightlifting every 2 days.  Genia Del MD, 11:23 AM 01/20/2020

## 2020-01-20 NOTE — Addendum Note (Signed)
Addended by: Berna Spare A on: 01/20/2020 11:44 AM   Modules accepted: Orders

## 2020-01-22 LAB — PAP IG W/ RFLX HPV ASCU

## 2020-02-12 ENCOUNTER — Other Ambulatory Visit: Payer: Self-pay

## 2020-02-12 ENCOUNTER — Ambulatory Visit: Payer: Commercial Managed Care - PPO | Admitting: Nurse Practitioner

## 2020-02-12 ENCOUNTER — Encounter: Payer: Self-pay | Admitting: Nurse Practitioner

## 2020-02-12 VITALS — BP 120/76 | HR 78 | Temp 97.9°F | Ht 60.5 in | Wt 174.6 lb

## 2020-02-12 DIAGNOSIS — Z23 Encounter for immunization: Secondary | ICD-10-CM

## 2020-02-12 DIAGNOSIS — E669 Obesity, unspecified: Secondary | ICD-10-CM | POA: Diagnosis not present

## 2020-02-12 NOTE — Progress Notes (Signed)
Tomasa Hose as a scribe for Arnette Felts, FNP.,have documented all relevant documentation on the behalf of Arnette Felts, FNP,as directed by  Arnette Felts, FNP while in the presence of Arnette Felts, FNP.  This visit occurred during the SARS-CoV-2 public health emergency.  Safety protocols were in place, including screening questions prior to the visit, additional usage of staff PPE, and extensive cleaning of exam room while observing appropriate contact time as indicated for disinfecting solutions.  Subjective:     Patient ID: Christy Graham , female    DOB: Apr 08, 1981 , 39 y.o.   MRN: [id number]   Chief Complaint  Patient presents with  . Weight Check    HPI  Pt presents today for weight check she was taking phentermine but had an allergic reaction, would like to try something different for weight loss.  She is here today with a Spanish interpreter.  She continues to walk 10-15 minutes a day 3 days a week.  2-3 bottles of water a day.  Coffee/tea does add sugar and creamer (flavored).  No sodas or juices.  She eats rice, beans, vegetables, meat and fish. Will eat "junk food" once a week.  Cooks more at home. She has been trying to lose weight over the last few months.    Wt Readings from Last 3 Encounters: 02/12/20 : 174 lb 9.6 oz (79.2 kg) 01/20/20 : 175 lb (79.4 kg) 11/06/19 : 174 lb 9.6 oz (79.2 kg)      Past Medical History:  Diagnosis Date  . Anemia      Family History  Problem Relation Age of Onset  . Diabetes Mother      Current Outpatient Medications:  .  ferrous sulfate 325 (65 FE) MG tablet, Take 325 mg by mouth 3 (three) times daily. Increased on 07/17/18, Disp: , Rfl:  .  vitamin B-12 (CYANOCOBALAMIN) 100 MCG tablet, Take 100 mcg by mouth 2 (two) times daily., Disp: , Rfl:    Allergies  Allergen Reactions  . Phentermine     rash     Review of Systems  Constitutional: Negative.   HENT: Negative.   Respiratory: Negative.   Genitourinary:  Negative.   Musculoskeletal: Negative.   Skin: Negative.   Psychiatric/Behavioral: Negative.      Today's Vitals   02/12/20 0850  BP: 120/76  Pulse: 78  Temp: 97.9 F (36.6 C)  TempSrc: Oral  Weight: 174 lb 9.6 oz (79.2 kg)  Height: 5' 0.5" (1.537 m)  PainSc: 0-No pain   Body mass index is 33.54 kg/m.   Objective:  Physical Exam Vitals reviewed.  Constitutional:      General: She is not in acute distress.    Appearance: Normal appearance. She is obese.  Cardiovascular:     Rate and Rhythm: Normal rate and regular rhythm.     Pulses: Normal pulses.     Heart sounds: Normal heart sounds. No murmur heard.   Pulmonary:     Effort: Pulmonary effort is normal. No respiratory distress.     Breath sounds: Normal breath sounds.  Neurological:     General: No focal deficit present.     Mental Status: She is alert and oriented to person, place, and time.     Cranial Nerves: No cranial nerve deficit.  Psychiatric:        Mood and Affect: Mood normal.        Behavior: Behavior normal.        Thought Content: Thought content normal.  Judgment: Judgment normal.         Assessment And Plan:     1. Obesity (BMI 30-39.9)  2. Need for influenza vaccination - Flu Vaccine QUAD 36+ mos IM   Chronic  She would like to work on increasing her physical activity and eat more healthy and at next visit    Discussed in detail the differences in medications, Saxenda and Reginal Lutes are not on her formulary.    Discussed healthy diet and regular exercise options   Encouraged to exercise at least 20 minutes 4 days a week per week with 2 days of strength training  2. Need for influenza vaccination  Influenza vaccine administered  Encouraged to take Tylenol as needed for fever or muscle aches.  Patient was given opportunity to ask questions. Patient verbalized understanding of the plan and was able to repeat key elements of the plan. All questions were answered to their  satisfaction.   Jeanell Sparrow, FNP, have reviewed all documentation for this visit. The documentation on 02/12/20 for the exam, diagnosis, procedures, and orders are all accurate and complete.  THE PATIENT IS ENCOURAGED TO PRACTICE SOCIAL DISTANCING DUE TO THE COVID-19 PANDEMIC.

## 2020-02-12 NOTE — Patient Instructions (Addendum)
Goal to lose 4 lbs in 6 weeks  Goal to walk 20 minutes a day 4 days a week.  Keep a food log the week before coming for next visit in 6 weeks  Goal to drink at least 64 oz    Hacer ejercicio para bajar de peso Exercising to [employer] El ejercicio es la actividad fsica estructurada y repetitiva que se realiza para mejorar el [place] fsico y Radiographer, therapeutic. Hacer ejercicio de forma regular es importante para todos. Es especialmente importante si tiene sobrepeso. El sobrepeso aumenta el riesgo de tener enfermedad cardaca, accidente cerebrovascular, diabetes, presin arterial alta y varios tipos de cncer. Reducir la ingesta de caloras y hacer ejercicio pueden ayudarlo a bajar de [place]. El ejercicio por lo general se clasifica como de intensidad moderada o vigorosa. Para bajar de peso, la [employer] de las personas debe hacer una determinada cantidad de ejercicio de intensidad moderada o vigorosa cada semana. Ejercicio de intensidad moderada  El ejercicio de intensidad moderada es cualquier actividad que lo haga moverse lo suficiente como para quemar al menos tres veces ms energa (caloras) que si estuviera sentado. Algunos ejemplos de ejercicio de intensidad moderada son:  Caminar una milla (1,6 kilmetros) en 15 minutos.  Hacer trabajos de jardinera livianos.  Andar en bicicleta a un ritmo fcil de aguantar. La [employer] de las personas debe hacer al menos 150 minutos (2 horas y 30 minutos) por semana de ejercicio de intensidad moderada para Pharmacologist su Runner, broadcasting/film/video. Ejercicio de intensidad vigorosa El ejercicio de intensidad vigorosa es cualquier actividad que lo haga moverse lo suficiente como para quemar al menos seis veces ms caloras que si estuviera sentado. Al hacer ejercicio a esta intensidad, su nivel de esfuerzo debera ser lo suficientemente alto como para no permitirle Diplomatic Services operational officer. Algunos ejemplos de ejercicio de intensidad vigorosa son:  Environmental consultant.  Practicar un  deporte de equipo, como ftbol americano, baloncesto y ftbol.  Saltar la cuerda. La [employer] de las personas debe hacer al menos 75 minutos (1 hora y 15 minutos) por semana de ejercicio de intensidad vigorosa para mantener su Runner, broadcasting/film/video. Cmo puede afectarme el ejercicio? Cuando hace suficiente ejercicio como para quemar ms caloras que las que consume, pierde [place]. Tambin reduce la grasa corporal y aumenta la masa muscular. Cuanto ms msculo tenga, mayor cantidad de Veterinary surgeon. El ejercicio tambin:  Mejora el estado de nimo.  Reduce el estrs y las tensiones.  Mejora el estado fsico general, la flexibilidad y la resistencia.  Aumenta la fuerza sea. La cantidad de ejercicio que necesita realizar para bajar de peso depende de:  Su edad.  El tipo de ejercicio.  Cualquier afeccin de [employer].  Su capacidad fsica general. Pregntele al mdico cunto ejercicio debe realizar y qu tipos de actividades son seguras para usted. Qu medidas puedo tomar para bajar de peso? Nutricin   [employer] dieta como se lo haya indicado el mdico o especialista en alimentacin y nutricin (nutricionista). Esto puede incluir lo siguiente: ? Consumir menos caloras. ? Consumir ms protenas. ? Consumir menos grasas no saludables. ? Seguir una dieta que incluya frutas y verduras frescas, cereales integrales, productos lcteos semidescremados y Associate Professor. ? Evite los alimentos con grasa, sal y azcar agregadas.  Beba gran cantidad de agua mientras hace ejercicio para evitar la deshidratacin o los golpes de Airline pilot. Actividad  Elija una actividad que disfrute y establezca objetivos realistas. El mdico puede ayudarlo a Runner, broadcasting/film/video de ejercicio  que funcione para usted.  Haga ejercicio a una intensidad moderada o vigorosa la [employer] de la [place]. ? La intensidad de la actividad fsica puede variar de Neomia Dear persona a Educational psychologist. Puede saber qu tan intensa  una rutina de ejercicios es para usted al Art therapist atencin a su respiracin y latidos cardacos. La [employer] de las personas notar que su respiracin y latidos cardacos se aceleran al Education officer, environmental ejercicio de mayor intensidad.  Haga entrenamiento de resistencia dos veces por semana, como: ? Flexiones de [employer]. ? Abdominales. ? Levantamiento de pesas. ? Uso de bandas elsticas de resistencia.  Hacer ejercicio en perodos cortos de [employer] ser tan til como los perodos largos y estructurados de ejercicio. Si tiene dificultad para encontrar tiempo para Materials engineer, trate de incluir el ejercicio en su rutina diaria. ? Levntese, estrese y camine cada a lo largo del Futures trader. ? Vaya a caminar durante su hora de almuerzo. ? Estacione el auto lejos de su lugar de destino. ? Si [place] transporte pblico, bjese una parada antes y camine el resto del camino. ? Pngase de pie y camine cada vez que hable por telfono. ? Utilice la Optometrist del ascensor o la Art gallery manager.  Use ropa cmoda y calzado con buen soporte.  No haga ejercicio en exceso que pudiera hacer que se lastime, se sienta mareado o tenga dificultad para respirar. Dnde buscar ms informacin  Departamento de Salud y [address] de los [place] (U.S. Department of Health and [employer]): ThisPath.fi  Centros para el Control y la Prevencin de Child psychotherapist for Disease Control and Prevention, CDC): FootballExhibition.com.br Comunquese con un mdico:  Antes de comenzar un nuevo programa de ejercicios.  Si tiene preguntas o inquietudes acerca de su peso.  Si tiene un problema mdico que Musician. Obtenga ayuda de inmediato si presenta alguno de los siguientes problemas al hacer ejercicio:  Lesiones.  Mareos.  Dificultad para respirar o falta de aire que no desaparecen al dejar de hacer ejercicio.  Dolor en el pecho.  Latidos cardacos rpidos. Resumen  El sobrepeso  aumenta el riesgo de tener enfermedad cardaca, accidente cerebrovascular, diabetes, presin arterial alta y varios tipos de cncer.  Para perder peso debe quemar ms caloras que las que consume.  Reducir la cantidad de caloras que consume, adems de hacer ejercicio de intensidad moderada o vigorosa todas las [place], [place] a Curator. Esta informacin no tiene Theme park manager el consejo del mdico. Asegrese de hacerle al mdico cualquier pregunta que tenga. Document Revised: 07/20/2017 Document Reviewed: 07/20/2017 Elsevier Patient Education  2020 [employer].

## 2020-03-23 ENCOUNTER — Encounter: Payer: Commercial Managed Care - PPO | Admitting: Nurse Practitioner

## 2021-01-21 ENCOUNTER — Other Ambulatory Visit: Payer: Self-pay

## 2021-01-21 ENCOUNTER — Ambulatory Visit
Admission: RE | Admit: 2021-01-21 | Discharge: 2021-01-21 | Disposition: A | Discharge status: Home / Self Care | Payer: Commercial Managed Care - PPO | Source: Ambulatory Visit | Attending: Internal Medicine | Admitting: Internal Medicine

## 2021-01-21 ENCOUNTER — Ambulatory Visit (INDEPENDENT_AMBULATORY_CARE_PROVIDER_SITE_OTHER): Payer: Commercial Managed Care - PPO | Admitting: Obstetrics & Gynecology

## 2021-01-21 ENCOUNTER — Other Ambulatory Visit: Payer: Self-pay | Admitting: Internal Medicine

## 2021-01-21 ENCOUNTER — Encounter: Payer: Self-pay | Admitting: Obstetrics & Gynecology

## 2021-01-21 VITALS — BP 120/78 | HR 88 | Resp 16 | Ht 60.25 in | Wt 175.0 lb

## 2021-01-21 DIAGNOSIS — Z1231 Encounter for screening mammogram for malignant neoplasm of breast: Secondary | ICD-10-CM

## 2021-01-21 DIAGNOSIS — E6609 Other obesity due to excess calories: Secondary | ICD-10-CM | POA: Diagnosis not present

## 2021-01-21 DIAGNOSIS — Z9851 Tubal ligation status: Secondary | ICD-10-CM

## 2021-01-21 DIAGNOSIS — Z6833 Body mass index (BMI) 33.0-33.9, adult: Secondary | ICD-10-CM

## 2021-01-21 DIAGNOSIS — Z01419 Encounter for gynecological examination (general) (routine) without abnormal findings: Secondary | ICD-10-CM

## 2021-01-21 NOTE — Progress Notes (Signed)
Christy Graham 12/16/1980 [id number]   History:    40 y.o. [id number] Married x 17 years. S/P TL.  Children  25 daughter, 66 daughter, 43 son and 55 yo daughter.   RP:  Established patient presenting for annual gyn exam    HPI: Status post tubal ligation.  Menses regular every month with normal flow.  No BTB.  Patient taking vitamins with iron.  No pelvic pain.  No pain with intercourse.  History of abnormal Pap about 12 years ago.  Had a colposcopy with biopsies but no treatment necessary per patient.  Pap Neg since then, last 01/2020. Normal vaginal secretions.  Urine normal.  Bowel movements normal.  Breasts normal.  BMI 33.89.  Walking.  Health labs with family physician.   Past medical history,surgical history, family history and social history were all reviewed and documented in the EPIC chart.  Gynecologic History Patient's last menstrual period was 01/10/2021 (exact date).  Obstetric History OB History  Gravida Para Term Preterm AB Living  5 4 4   1 4   SAB IAB Ectopic Multiple Live Births  1       4    # Outcome Date GA Lbr Len/2nd Weight Sex Delivery Anes PTL Lv  5 Term 05/12/11 [redacted]w[redacted]d  5 lb 9.8 oz (2.545 kg) F CS-LTranv Spinal  LIV  4 SAB 07/06/05 [redacted]w[redacted]d   F  None  DEC  3 Term 02/19/03 [redacted]w[redacted]d   M Vag-Spont None N LIV  2 Term 03/17/99 [redacted]w[redacted]d   F Vag-Spont None N LIV  1 Term 02/18/86 [redacted]w[redacted]d   F Vag-Spont None N LIV     ROS: A ROS was performed and pertinent positives and negatives are included in the history.  GENERAL: No fevers or chills. HEENT: No change in vision, no earache, sore throat or sinus congestion. NECK: No pain or stiffness. CARDIOVASCULAR: No chest pain or pressure. No palpitations. PULMONARY: No shortness of breath, cough or wheeze. GASTROINTESTINAL: No abdominal pain, nausea, vomiting or diarrhea, melena or bright red blood per rectum. GENITOURINARY: No urinary frequency, urgency, hesitancy or dysuria. MUSCULOSKELETAL: No joint or muscle pain, no back pain,  no recent trauma. DERMATOLOGIC: No rash, no itching, no lesions. ENDOCRINE: No polyuria, polydipsia, no heat or cold intolerance. No recent change in weight. HEMATOLOGICAL: No anemia or easy bruising or bleeding. NEUROLOGIC: No headache, seizures, numbness, tingling or weakness. PSYCHIATRIC: No depression, no loss of interest in normal activity or change in sleep pattern.     Exam:   BP 120/78   Pulse 88   Resp 16   Ht 5' 0.25" (1.53 m)   Wt 175 lb (79.4 kg)   LMP 01/10/2021 (Exact Date) Comment: BTL  BMI 33.89 kg/m   Body mass index is 33.89 kg/m.  General appearance : Well developed well nourished female. No acute distress HEENT: Eyes: no retinal hemorrhage or exudates,  Neck supple, trachea midline, no carotid bruits, no thyroidmegaly Lungs: Clear to auscultation, no rhonchi or wheezes, or rib retractions  Heart: Regular rate and rhythm, no murmurs or gallops Breast:Examined in sitting and supine position were symmetrical in appearance, no palpable masses or tenderness,  no skin retraction, no nipple inversion, no nipple discharge, no skin discoloration, no axillary or supraclavicular lymphadenopathy Abdomen: no palpable masses or tenderness, no rebound or guarding Extremities: no edema or skin discoloration or tenderness  Pelvic: Vulva: Normal             Vagina: No gross lesions or discharge  Cervix:  No gross lesions or discharge  Uterus  AV, normal size, shape and consistency, non-tender and mobile  Adnexa  Without masses or tenderness  Anus: Normal   Assessment/Plan:  40 y.o. female for annual exam   1. Well female exam with routine gynecological exam Normal gynecologic exam.  Last Pap test August 2021 was negative, will repeat Pap test next year.  Breast exam normal.  Patient overdue for screening mammogram, will schedule now at the breast center.  Health labs with family physician.  2. S/P tubal ligation  3. Class 1 obesity due to excess calories without serious  comorbidity with body mass index (BMI) of 33.0 to 33.9 in adult  Recommend a lower calorie/carb diet.  Aerobic activities 5 times a week and light weightlifting every 2 days.  Genia Del MD, 10:47 AM 01/21/2021

## 2021-02-01 ENCOUNTER — Other Ambulatory Visit: Payer: Self-pay

## 2021-02-01 ENCOUNTER — Encounter: Payer: Self-pay | Admitting: Podiatry

## 2021-02-01 ENCOUNTER — Ambulatory Visit: Payer: Commercial Managed Care - PPO | Admitting: Podiatry

## 2021-02-01 DIAGNOSIS — L600 Ingrowing nail: Secondary | ICD-10-CM | POA: Diagnosis not present

## 2021-02-01 MED ORDER — NEOMYCIN-POLYMYXIN-HC 1 % OT SOLN: OTIC | 1 refills | Status: DC

## 2021-02-01 NOTE — Patient Instructions (Signed)

## 2021-02-01 NOTE — Progress Notes (Signed)
  Subjective:  Patient ID: Diedra Sinor [facility], female    DOB: [birth year],  MRN: [id number] HPI Chief Complaint  Patient presents with   Toe Pain    Hallux bilateral - lateral borders, ingrown x several months, PCP checked when they were red and swollen, got better, but enclosed shoes at work aggravate and make really sore   New Patient (Initial Visit)    40 y.o. female presents with the above complaint.   ROS: Denies fever chills nausea vomiting muscle aches pains calf pain back pain chest pain shortness of breath.  Past Medical History:  Diagnosis Date   Anemia    Past Surgical History:  Procedure Laterality Date   CESAREAN SECTION  05/12/2011   Procedure: CESAREAN SECTION;  Surgeon: Kathreen Cosier, MD;  Location: [facility];  Service: Gynecology;  Laterality: N/A;   EYE SURGERY Bilateral 06/06/2015   removal of ptyrigium   TUBAL LIGATION      Current Outpatient Medications:    NEOMYCIN-POLYMYXIN-HYDROCORTISONE (CORTISPORIN) 1 % SOLN OTIC solution, Apply 1-2 drops to toe BID after soaking, Disp: 10 mL, Rfl: 1   ferrous sulfate 325 (65 FE) MG tablet, Take 325 mg by mouth 3 (three) times daily. Increased on 07/17/18, Disp: , Rfl:   Allergies  Allergen Reactions   Phentermine     rash   Review of Systems Objective:  There were no vitals filed for this visit.  General: Well developed, nourished, in no acute distress, alert and oriented x3   Dermatological: Skin is warm, dry and supple bilateral. Nails x 10 are well maintained; remaining integument appears unremarkable at this time. There are no open sores, no preulcerative lesions, no rash or signs of infection present.  Sharply abraded nail margins painful palpation fibular borders of the hallux bilateral.  Right seems to be worse than the left there is no purulence no malodor.  Vascular: Dorsalis Pedis artery and Posterior Tibial artery pedal pulses are 2/4 bilateral with immedate capillary fill time. Pedal hair growth  present. No varicosities and no lower extremity edema present bilateral.   Neruologic: Grossly intact via light touch bilateral. Vibratory intact via tuning fork bilateral. Protective threshold with Semmes Wienstein monofilament intact to all pedal sites bilateral. Patellar and Achilles deep tendon reflexes 2+ bilateral. No Babinski or clonus noted bilateral.   Musculoskeletal: No gross boney pedal deformities bilateral. No pain, crepitus, or limitation noted with foot and ankle range of motion bilateral. Muscular strength 5/5 in all groups tested bilateral.  Gait: Unassisted, Nonantalgic.    Radiographs:  None taken  Assessment & Plan:   Assessment: Ingrown toenail fibular border hallux bilateral  Plan: Chemical matrixectomy fibular border hallux bilateral.  She was provided with both oral and written home-going instructions for care and soaking the toe as well as a prescription for Cortisporin Otic to be applied twice daily after soaking.  I like to follow-up with her in about 2 weeks to make sure she is doing well.     Alany Borman T. [place], [place]

## 2021-02-24 ENCOUNTER — Ambulatory Visit: Payer: Commercial Managed Care - PPO | Admitting: Podiatry

## 2021-02-24 ENCOUNTER — Ambulatory Visit (INDEPENDENT_AMBULATORY_CARE_PROVIDER_SITE_OTHER): Payer: Commercial Managed Care - PPO

## 2021-02-24 ENCOUNTER — Other Ambulatory Visit: Payer: Self-pay

## 2021-02-24 ENCOUNTER — Encounter: Payer: Self-pay | Admitting: Podiatry

## 2021-02-24 DIAGNOSIS — L03032 Cellulitis of left toe: Secondary | ICD-10-CM

## 2021-02-24 DIAGNOSIS — M722 Plantar fascial fibromatosis: Secondary | ICD-10-CM

## 2021-02-24 MED ORDER — TRIAMCINOLONE ACETONIDE 40 MG/ML IJ SUSP
20.0000 mg | Freq: Once | INTRAMUSCULAR | Status: AC
  Administered 2021-02-24: 20 mg

## 2021-02-24 MED ORDER — DOXYCYCLINE HYCLATE 100 MG PO TABS: 100.0000 mg | ORAL_TABLET | Freq: Two times a day (BID) | ORAL | 0 refills | Status: DC

## 2021-02-27 NOTE — Progress Notes (Signed)
She presents today for follow-up status post matrixectomy.  She states the toe is still sore and tender.  Particularly the left 1 and she still soaking.  She is complaining of pain to the left heel as well.  Objective: Vital signs are stable alert and oriented x3.  Moderate edema and erythema no purulence no malodor hallux left she has pain on palpation medial calcaneal tubercle of the left heel.  Assessment: Planter fasciitis left.  Status post matrixectomy with paronychia hallux left.  Plan: Started on doxycycline and injected her left heel 20 mg Kenalog 5 mg of Marcaine follow-up with her in 4 weeks if necessary

## 2021-03-29 ENCOUNTER — Ambulatory Visit: Payer: Commercial Managed Care - PPO | Admitting: Podiatry

## 2021-03-29 ENCOUNTER — Encounter: Payer: Self-pay | Admitting: Podiatry

## 2021-03-29 ENCOUNTER — Other Ambulatory Visit: Payer: Self-pay

## 2021-03-29 DIAGNOSIS — L03031 Cellulitis of right toe: Secondary | ICD-10-CM | POA: Diagnosis not present

## 2021-03-29 DIAGNOSIS — M722 Plantar fascial fibromatosis: Secondary | ICD-10-CM

## 2021-03-29 DIAGNOSIS — L03032 Cellulitis of left toe: Secondary | ICD-10-CM

## 2021-03-29 NOTE — Patient Instructions (Signed)

## 2021-03-30 NOTE — Progress Notes (Signed)
She presents today for follow-up of paronychia hallux bilaterally states that is still very sore is not growing out well.  And she states that her left heel is still tender.  Objective: Vital signs are stable alert oriented x3 hallux nails proximal margin laterally does demonstrate that there is probably a small spicule of nail present.  The toes are numbed up today and spicule of nail was removed in total the left heel is still mildly tender.  Assessment: Paronychia with spicules of nail fibular borders hallux bilateral Planter fasciitis.  Plan: At this point chemical matrixectomy's were once again performed to the lateral borders with spicules of the nail removed after local anesthetic was administered.  I will follow-up with her in a couple weeks at which time we will treat the Planter fasciitis as well.

## 2021-04-14 ENCOUNTER — Ambulatory Visit: Payer: Commercial Managed Care - PPO | Admitting: Podiatry

## 2021-05-19 ENCOUNTER — Encounter: Payer: Self-pay | Admitting: Podiatry

## 2021-05-19 ENCOUNTER — Ambulatory Visit: Payer: Commercial Managed Care - PPO | Admitting: Podiatry

## 2021-05-19 ENCOUNTER — Other Ambulatory Visit: Payer: Self-pay

## 2021-05-19 DIAGNOSIS — M722 Plantar fascial fibromatosis: Secondary | ICD-10-CM | POA: Diagnosis not present

## 2021-05-19 MED ORDER — METHYLPREDNISOLONE 4 MG PO TBPK: ORAL_TABLET | ORAL | 0 refills | Status: DC

## 2021-05-19 MED ORDER — TRIAMCINOLONE ACETONIDE 10 MG/ML IJ SUSP
10.0000 mg | Freq: Once | INTRAMUSCULAR | Status: AC
  Administered 2021-05-19: 10 mg

## 2021-05-19 MED ORDER — MELOXICAM 15 MG PO TABS: 15.0000 mg | ORAL_TABLET | Freq: Every day | ORAL | 3 refills | Status: DC

## 2021-05-21 NOTE — Progress Notes (Signed)
She presents today with interpreter stating that her matrixectomy's are healing very nicely she still having pain to the left heel.  Objective: Vital signs are stable she alert oriented x3 matricectomy sites are healing very nicely no erythema edema cellulitis drainage or odor she does have significant pain on palpation medial calcaneal tubercle of the left heel.  Pulses remain palpable and strong.  She does have a mild gastroc equinus.  Assessment well-healing matrixectomy's.  Plantar fasciitis left.  Plan: Planter fasciitis left heel start her on methylprednisolone to be followed by meloxicam.  I injected left heel 20 mg Kenalog 5 mg of Marcaine and dispensed a night splint.

## 2021-06-21 ENCOUNTER — Other Ambulatory Visit: Payer: Self-pay

## 2021-06-21 ENCOUNTER — Ambulatory Visit: Payer: Commercial Managed Care - PPO | Admitting: Podiatry

## 2021-06-21 ENCOUNTER — Encounter: Payer: Self-pay | Admitting: Podiatry

## 2021-06-21 DIAGNOSIS — M722 Plantar fascial fibromatosis: Secondary | ICD-10-CM | POA: Diagnosis not present

## 2021-06-21 MED ORDER — METHYLPREDNISOLONE 4 MG PO TBPK: ORAL_TABLET | ORAL | 0 refills | Status: DC

## 2021-06-21 NOTE — Progress Notes (Signed)
She presents today for follow-up of her Planter fasciitis left states that it is doing better there is just a little bit of pain now.  Objective: Vital signs are stable alert and x3 still has some mild tenderness on palpation medial calcaneal tubercle.  Assessment: Resolving Planter fasciitis.  Plan: Offer her an injection.  She did not want that.  So I started her back on methylprednisolone to be followed by meloxicam.  I will follow-up with her for orthotics in the near future.

## 2021-08-02 ENCOUNTER — Ambulatory Visit: Payer: Commercial Managed Care - PPO | Admitting: Podiatry

## 2021-08-25 ENCOUNTER — Encounter: Payer: Self-pay | Admitting: Podiatry

## 2021-08-25 ENCOUNTER — Ambulatory Visit: Payer: Commercial Managed Care - PPO

## 2021-08-25 ENCOUNTER — Ambulatory Visit: Payer: Commercial Managed Care - PPO | Admitting: Podiatry

## 2021-08-25 ENCOUNTER — Other Ambulatory Visit: Payer: Self-pay

## 2021-08-25 DIAGNOSIS — M722 Plantar fascial fibromatosis: Secondary | ICD-10-CM

## 2021-08-25 NOTE — Progress Notes (Signed)
SITUATION ?Patient Name:  Christy Graham [facility] ?MRN:   [id number] ?Reason for Visit: Evaluation for custom foot orthoses ? ?Patient Report: ?Chief Complaint: Patient would like to obtain pre-certification from their insurance regarding coverage and an out of pocket estimate before proceeding. ? ?OBJECTIVE DATA ?Patient History / Diagnosis:   ?  ICD-10-CM   ?1. Plantar fasciitis, left  M72.2   ?  ? ? ?Physician Recommended Device(s): Custom foot orthoses ? ?Laterality HCPCS Code  ?bilateral L3020  ? ?ACTIONS PERFORMED ?Patient was seen for evaluation for foot orthoses. Financial responsibility was discussed, and patient requested a check of benefits and an estimation of their out of pocket cost for the equipment.  ? ?PLAN ?patient is to be contacted once insurance pre-certification and out of pocket cost estimate is obtained so that they make an informed decision regarding plan of care. In the event patient elects not to pursue orthotic treatment, referring physician is to be contacted in order to update plan of care as needed. All questions were answered and concerns addressed. ? ? ? ? ? ?

## 2021-08-25 NOTE — Progress Notes (Signed)
She presents today for follow-up of her Planter fasciitis of her left foot she states that she is really not have any pain other than just first thing in the mornings. ? ?Objective: No reproducible pain on palpation.  Pulses are palpable no open lesions or wounds. ? ?Assessment: Resolving Planter fasciitis. ? ?Plan: I think orthotics are going to be good for her so we sent her to [facility] today to have orthotics made also order to keep her on meloxicam. ?

## 2021-11-10 ENCOUNTER — Ambulatory Visit (INDEPENDENT_AMBULATORY_CARE_PROVIDER_SITE_OTHER): Payer: Commercial Managed Care - PPO | Admitting: Nurse Practitioner

## 2021-11-10 ENCOUNTER — Encounter: Payer: Self-pay | Admitting: Nurse Practitioner

## 2021-11-10 VITALS — BP 122/64 | HR 90 | Temp 98.3°F | Ht 60.25 in | Wt 175.0 lb

## 2021-11-10 DIAGNOSIS — R300 Dysuria: Secondary | ICD-10-CM

## 2021-11-10 DIAGNOSIS — E7800 Pure hypercholesterolemia, unspecified: Secondary | ICD-10-CM | POA: Diagnosis not present

## 2021-11-10 DIAGNOSIS — E6609 Other obesity due to excess calories: Secondary | ICD-10-CM

## 2021-11-10 DIAGNOSIS — R221 Localized swelling, mass and lump, neck: Secondary | ICD-10-CM

## 2021-11-10 DIAGNOSIS — Z0000 Encounter for general adult medical examination without abnormal findings: Secondary | ICD-10-CM

## 2021-11-10 DIAGNOSIS — D500 Iron deficiency anemia secondary to blood loss (chronic): Secondary | ICD-10-CM

## 2021-11-10 DIAGNOSIS — Z6833 Body mass index (BMI) 33.0-33.9, adult: Secondary | ICD-10-CM

## 2021-11-10 LAB — POCT URINALYSIS DIPSTICK
Bilirubin, UA: NEGATIVE
Glucose, UA: NEGATIVE
Ketones, UA: NEGATIVE
Leukocytes, UA: NEGATIVE
Nitrite, UA: NEGATIVE
Protein, UA: NEGATIVE
Spec Grav, UA: 1.025 (ref 1.010–1.025)
Urobilinogen, UA: 0.2 E.U./dL
pH, UA: 6 (ref 5.0–8.0)

## 2021-11-10 NOTE — Progress Notes (Signed)
I,Tianna Badgett,acting as a Education administrator for [employer], FNP.,have documented all relevant documentation on the behalf of Minette Brine, FNP,as directed by  Minette Brine, FNP while in the presence of Minette Brine, [place].  This visit occurred during the SARS-CoV-2 public health emergency.  Safety protocols were in place, including screening questions prior to the visit, additional usage of staff PPE, and extensive cleaning of exam room while observing appropriate contact time as indicated for disinfecting solutions.  Subjective:     Patient ID: Christy Graham , female    DOB: 1981-04-16 , 41 y.o.   MRN: [id number]   Chief Complaint  Patient presents with   Annual Exam    HPI  Patient presents today for HM. Patient is followed by [facility]. She is here today with the interpreter.      Past Medical History:  Diagnosis Date   Anemia      Family History  Problem Relation Age of Onset   Diabetes Mother      Current Outpatient Medications:    ferrous sulfate 325 (65 FE) MG tablet, Take 325 mg by mouth 3 (three) times daily. Increased on 07/17/18, Disp: , Rfl:    meloxicam (MOBIC) 15 MG tablet, Take 1 tablet (15 mg total) by mouth daily., Disp: 30 tablet, Rfl: 3   Allergies  Allergen Reactions   Phentermine     rash      The patient states she uses tubal ligation for birth control.  Patient's last menstrual period was 10/18/2021 (exact date).  Negative for Dysmenorrhea and Negative for Menorrhagia. She has been having her menstrual cycle for more days - between 8-10 days, was 3-4 days. Negative for: breast discharge, breast lump(s), breast pain and breast self exam. Associated symptoms include abnormal vaginal bleeding. Pertinent negatives include abnormal bleeding (hematology), anxiety, decreased libido, depression, difficulty falling sleep, dyspareunia, history of infertility, nocturia, sexual dysfunction, sleep disturbances, urinary incontinence, urinary urgency,  vaginal discharge and vaginal itching. Diet regular. The patient states her exercise level is minimal with walking two days a week for 12-15 minutes.   The patient's tobacco use is:  Social History   Tobacco Use  Smoking Status Never  Smokeless Tobacco Never   She has been exposed to passive smoke. The patient's alcohol use is:  Social History   Substance and Sexual Activity  Alcohol Use Yes   Comment: occ   Additional information: Last pap 01/15/2019, next one scheduled for 01/14/2022.    Review of Systems  Constitutional: Negative.   HENT: Negative.    Eyes: Negative.   Respiratory: Negative.    Cardiovascular: Negative.   Gastrointestinal: Negative.   Endocrine: Negative.   Genitourinary:  Positive for dysuria (intermittent).  Musculoskeletal: Negative.   Skin:  Negative for wound.       Lump to left side of neck  Allergic/Immunologic: Negative.   Neurological: Negative.   Hematological: Negative.   Psychiatric/Behavioral: Negative.       Today's Vitals   11/10/21 0932  BP: 122/64  Pulse: 90  Temp: 98.3 F (36.8 C)  TempSrc: Oral  Weight: 175 lb (79.4 kg)  Height: 5' 0.25" (1.53 m)   Body mass index is 33.89 kg/m.  Wt Readings from Last 3 Encounters:  11/10/21 175 lb (79.4 kg)  01/21/21 175 lb (79.4 kg)  02/12/20 174 lb 9.6 oz (79.2 kg)  . Objective:  Physical Exam Vitals reviewed.  Constitutional:      General: She is not in acute distress.  Appearance: Normal appearance. She is well-developed. She is obese.  HENT:     Head: Normocephalic and atraumatic.     Right Ear: Hearing, tympanic membrane, ear canal and external ear normal. There is no impacted cerumen.     Left Ear: Hearing, tympanic membrane, ear canal and external ear normal. There is no impacted cerumen.     Nose: Nose normal.     Mouth/Throat:     Mouth: Mucous membranes are moist.  Eyes:     General: Lids are normal.     Extraocular Movements: Extraocular movements intact.      Conjunctiva/sclera: Conjunctivae normal.     Pupils: Pupils are equal, round, and reactive to light.     Funduscopic exam:    Right eye: No papilledema.        Left eye: No papilledema.  Neck:     Thyroid: No thyroid mass.     Vascular: No carotid bruit.     Comments: Firm mass to left posterior cervical chain.  Cardiovascular:     Rate and Rhythm: Normal rate and regular rhythm.     Pulses: Normal pulses.     Heart sounds: Normal heart sounds. No murmur heard. Pulmonary:     Effort: Pulmonary effort is normal. No respiratory distress.     Breath sounds: Normal breath sounds. No wheezing.  Chest:     Chest wall: No mass.  Breasts:    Tanner Score is 5.     Right: Normal. No mass or tenderness.     Left: Normal. No mass or tenderness.  Abdominal:     General: Abdomen is flat. Bowel sounds are normal. There is no distension.     Palpations: Abdomen is soft.     Tenderness: There is no abdominal tenderness.  Genitourinary:    Comments: Deferred - followed by GYN Musculoskeletal:        General: No swelling. Normal range of motion.     Cervical back: Full passive range of motion without pain, normal range of motion and neck supple.     Right lower leg: No edema.     Left lower leg: No edema.  Lymphadenopathy:     Cervical: Cervical adenopathy present.     Left cervical: Posterior cervical adenopathy present. No superficial cervical adenopathy.     Upper Body:     Right upper body: No supraclavicular, axillary or pectoral adenopathy.     Left upper body: No supraclavicular, axillary or pectoral adenopathy.  Skin:    General: Skin is warm and dry.     Capillary Refill: Capillary refill takes less than 2 seconds.  Neurological:     General: No focal deficit present.     Mental Status: She is alert and oriented to person, place, and time.     Cranial Nerves: No cranial nerve deficit.     Sensory: No sensory deficit.  Psychiatric:        Mood and Affect: Mood normal.         Behavior: Behavior normal.        Thought Content: Thought content normal.        Judgment: Judgment normal.         Assessment And Plan:     1. Encounter for annual physical exam Behavior modifications discussed and diet history reviewed.   Pt will continue to exercise regularly and modify diet with low GI, plant based foods and decrease intake of processed foods.  Recommend intake of daily multivitamin, Vitamin  D, and calcium.  Recommend mammogram for preventive screenings, as well as recommend immunizations that include influenza, TDAP - CBC  2. Class 1 obesity due to excess calories without serious comorbidity with body mass index (BMI) of 33.0 to 33.9 in adult She is encouraged to strive for BMI less than 30 to decrease cardiac risk. Advised to aim for at least 150 minutes of exercise per week.  3. Pure hypercholesterolemia Comments: Diet controlled, continue with low fat diet - Lipid panel - CMP14+EGFR  4. Iron deficiency anemia due to chronic blood loss Comments: Will recheck levels she does have longer menstrual cycles 8-10 days - Iron, TIBC and Ferritin Panel  5. Dysuria Comments: Urinalysis normal except trace blood.  - POCT urinalysis dipstick  6. Lump in neck Comments: Firm mass to left posterior cervical chain will check ultrasound - US Soft Tissue Head/Neck (NON-THYROID); Future - HIV antibody (with reflex)   Patient was given opportunity to ask questions. Patient verbalized understanding of the plan and was able to repeat key elements of the plan. All questions were answered to their satisfaction.   Minette Brine, FNP   I, Minette Brine, FNP, have reviewed all documentation for this visit. The documentation on 11/10/21 for the exam, diagnosis, procedures, and orders are all accurate and complete.   THE PATIENT IS ENCOURAGED TO PRACTICE SOCIAL DISTANCING DUE TO THE COVID-19 PANDEMIC.

## 2021-11-10 NOTE — Patient Instructions (Signed)

## 2021-11-11 LAB — CMP14+EGFR
ALT: 16 IU/L (ref 0–32)
AST: 17 IU/L (ref 0–40)
Albumin/Globulin Ratio: 1.5 (ref 1.2–2.2)
Albumin: 4.3 g/dL (ref 3.8–4.8)
Alkaline Phosphatase: 107 IU/L (ref 44–121)
BUN/Creatinine Ratio: 20 (ref 9–23)
BUN: 11 mg/dL (ref 6–24)
Bilirubin Total: 0.2 mg/dL (ref 0.0–1.2)
CO2: 23 mmol/L (ref 20–29)
Calcium: 9.4 mg/dL (ref 8.7–10.2)
Chloride: 105 mmol/L (ref 96–106)
Creatinine, Ser: 0.54 mg/dL — ABNORMAL LOW (ref 0.57–1.00)
Globulin, Total: 2.8 g/dL (ref 1.5–4.5)
Glucose: 76 mg/dL (ref 70–99)
Potassium: 4 mmol/L (ref 3.5–5.2)
Sodium: 139 mmol/L (ref 134–144)
Total Protein: 7.1 g/dL (ref 6.0–8.5)
eGFR: 118 mL/min/{1.73_m2} (ref >59)

## 2021-11-11 LAB — CBC
Hematocrit: 38.3 % (ref 34.0–46.6)
Hemoglobin: 12.6 g/dL (ref 11.1–15.9)
MCH: 26.3 pg — ABNORMAL LOW (ref 26.6–33.0)
MCHC: 32.9 g/dL (ref 31.5–35.7)
MCV: 80 fL (ref 79–97)
Platelets: 242 10*3/uL (ref 150–450)
RBC: 4.8 x10E6/uL (ref 3.77–5.28)
RDW: 13.7 % (ref 11.7–15.4)
WBC: 6.6 10*3/uL (ref 3.4–10.8)

## 2021-11-11 LAB — LIPID PANEL
Chol/HDL Ratio: 3.9 ratio (ref 0.0–4.4)
Cholesterol, Total: 168 mg/dL (ref 100–199)
HDL: 43 mg/dL (ref >39)
LDL Chol Calc (NIH): 100 mg/dL — ABNORMAL HIGH (ref 0–99)
Triglycerides: 143 mg/dL (ref 0–149)
VLDL Cholesterol Cal: 25 mg/dL (ref 5–40)

## 2021-11-11 LAB — IRON,TIBC AND FERRITIN PANEL
Ferritin: 35 ng/mL (ref 15–150)
Iron Saturation: 14 % — ABNORMAL LOW (ref 15–55)
Iron: 52 ug/dL (ref 27–159)
Total Iron Binding Capacity: 384 ug/dL (ref 250–450)
UIBC: 332 ug/dL (ref 131–425)

## 2021-11-11 LAB — HIV ANTIBODY (ROUTINE TESTING W REFLEX): HIV Screen 4th Generation wRfx: NONREACTIVE

## 2021-11-16 ENCOUNTER — Ambulatory Visit
Admission: RE | Admit: 2021-11-16 | Discharge: 2021-11-16 | Disposition: A | Discharge status: Home / Self Care | Payer: Commercial Managed Care - PPO | Source: Ambulatory Visit | Attending: Nurse Practitioner | Admitting: Nurse Practitioner

## 2021-11-16 DIAGNOSIS — R221 Localized swelling, mass and lump, neck: Secondary | ICD-10-CM

## 2021-12-02 ENCOUNTER — Other Ambulatory Visit: Payer: Self-pay

## 2021-12-02 MED ORDER — FERROUS SULFATE 325 (65 FE) MG PO TABS: 325.0000 mg | ORAL_TABLET | Freq: Every day | ORAL | 0 refills | Status: DC

## 2022-01-27 ENCOUNTER — Ambulatory Visit: Payer: Commercial Managed Care - PPO | Admitting: Obstetrics & Gynecology

## 2022-01-30 ENCOUNTER — Encounter: Payer: Self-pay | Admitting: Obstetrics & Gynecology

## 2022-01-30 ENCOUNTER — Other Ambulatory Visit (HOSPITAL_COMMUNITY)
Admission: RE | Admit: 2022-01-30 | Discharge: 2022-01-30 | Disposition: A | Discharge status: Home / Self Care | Payer: Commercial Managed Care - PPO | Source: Ambulatory Visit | Attending: Obstetrics & Gynecology | Admitting: Obstetrics & Gynecology

## 2022-01-30 ENCOUNTER — Ambulatory Visit (INDEPENDENT_AMBULATORY_CARE_PROVIDER_SITE_OTHER): Payer: Commercial Managed Care - PPO | Admitting: Obstetrics & Gynecology

## 2022-01-30 VITALS — BP 124/78 | Ht 60.0 in | Wt 176.0 lb

## 2022-01-30 DIAGNOSIS — Z01419 Encounter for gynecological examination (general) (routine) without abnormal findings: Secondary | ICD-10-CM | POA: Insufficient documentation

## 2022-01-30 DIAGNOSIS — Z9851 Tubal ligation status: Secondary | ICD-10-CM | POA: Diagnosis not present

## 2022-01-30 NOTE — Progress Notes (Signed)
Christy Graham [facility] 1980/06/14 [id number]   History:    41 y.o.  [id number] Married x 18 years. S/P TL.  Children  32 daughter, 55 daughter, 19 son and 45 yo daughter.   RP:  Established patient presenting for annual gyn exam    HPI: Status post tubal ligation.  Menses regular every month with stable flow x 6-7 days with cramps.  Will use Ibuprofen regularly during menses.  No BTB.  Patient taking vitamins with iron.  No pelvic pain.  No pain with intercourse.  History of abnormal Pap about 13 years ago.  Had a colposcopy with biopsies but no treatment necessary per patient.  Pap Neg since then, last 01/2020. Pap reflex today.  Normal vaginal secretions.  Urine normal.  Bowel movements normal. Breasts normal.  Mammo normal in 2023 per patient at [facility].  BMI 34.37.  Walking.  Health labs with family physician.    Past medical history,surgical history, family history and social history were all reviewed and documented in the EPIC chart.  Gynecologic History Patient's last menstrual period was 01/21/2022.  Obstetric History OB History  Gravida Para Term Preterm AB Living  5 4 4   1 4   SAB IAB Ectopic Multiple Live Births  1       4    # Outcome Date GA Lbr Len/2nd Weight Sex Delivery Anes PTL Lv  5 Term 05/12/11 [redacted]w[redacted]d  5 lb 9.8 oz (2.545 kg) F CS-LTranv Spinal  LIV  4 SAB 07/06/05 [redacted]w[redacted]d   F  None  DEC  3 Term 02/19/03 [redacted]w[redacted]d   M Vag-Spont None N LIV  2 Term 03/17/99 [redacted]w[redacted]d   F Vag-Spont None N LIV  1 Term 02/18/86 [redacted]w[redacted]d   F Vag-Spont None N LIV     ROS: A ROS was performed and pertinent positives and negatives are included in the history. GENERAL: No fevers or chills. HEENT: No change in vision, no earache, sore throat or sinus congestion. NECK: No pain or stiffness. CARDIOVASCULAR: No chest pain or pressure. No palpitations. PULMONARY: No shortness of breath, cough or wheeze. GASTROINTESTINAL: No abdominal pain, nausea, vomiting or diarrhea, melena or bright red blood per rectum.  GENITOURINARY: No urinary frequency, urgency, hesitancy or dysuria. MUSCULOSKELETAL: No joint or muscle pain, no back pain, no recent trauma. DERMATOLOGIC: No rash, no itching, no lesions. ENDOCRINE: No polyuria, polydipsia, no heat or cold intolerance. No recent change in weight. HEMATOLOGICAL: No anemia or easy bruising or bleeding. NEUROLOGIC: No headache, seizures, numbness, tingling or weakness. PSYCHIATRIC: No depression, no loss of interest in normal activity or change in sleep pattern.     Exam:   BP 124/78 (BP Location: Right Arm, Patient Position: Sitting, Cuff Size: Normal)   Ht 5' (1.524 m)   Wt 176 lb (79.8 kg)   LMP 01/21/2022   BMI 34.37 kg/m   Body mass index is 34.37 kg/m.  General appearance : Well developed well nourished female. No acute distress HEENT: Eyes: no retinal hemorrhage or exudates,  Neck supple, trachea midline, no carotid bruits, no thyroidmegaly Lungs: Clear to auscultation, no rhonchi or wheezes, or rib retractions  Heart: Regular rate and rhythm, no murmurs or gallops Breast:Examined in sitting and supine position were symmetrical in appearance, no palpable masses or tenderness,  no skin retraction, no nipple inversion, no nipple discharge, no skin discoloration, no axillary or supraclavicular lymphadenopathy Abdomen: no palpable masses or tenderness, no rebound or guarding Extremities: no edema or skin discoloration or tenderness  Pelvic:  Vulva: Normal             Vagina: No gross lesions or discharge  Cervix: No gross lesions or discharge.  Pap reflex done.  Uterus  AV, normal size, shape and consistency, non-tender and mobile  Adnexa  Without masses or tenderness  Anus: Normal   Assessment/Plan:  41 y.o. female for annual exam   1. Encounter for routine gynecological examination with Papanicolaou smear of cervix Status post tubal ligation.  Menses regular every month with stable flow x 6-7 days with cramps.  Will use Ibuprofen regularly during  menses.  No BTB.  Patient taking vitamins with iron.  No pelvic pain.  No pain with intercourse.  History of abnormal Pap about 13 years ago.  Had a colposcopy with biopsies but no treatment necessary per patient.  Pap Neg since then, last 01/2020. Pap reflex today.  Normal vaginal secretions.  Urine normal.  Bowel movements normal. Breasts normal.  Mammo normal in 2023 per patient at [facility].  BMI 34.37.  Walking.  Health labs with family physician. - Cytology - PAP( [facility])  2. S/P tubal ligation   Genia Del MD, 11:11 AM 01/30/2022

## 2022-02-02 LAB — CYTOLOGY - PAP: Diagnosis: NEGATIVE

## 2022-03-15 ENCOUNTER — Ambulatory Visit: Payer: Commercial Managed Care - PPO | Admitting: Podiatry

## 2022-03-15 ENCOUNTER — Encounter: Payer: Self-pay | Admitting: Podiatry

## 2022-03-15 DIAGNOSIS — L600 Ingrowing nail: Secondary | ICD-10-CM

## 2022-03-15 MED ORDER — NEOMYCIN-POLYMYXIN-HC 1 % OT SOLN: OTIC | 1 refills | Status: DC

## 2022-03-15 NOTE — Patient Instructions (Signed)

## 2022-03-16 ENCOUNTER — Telehealth: Payer: Self-pay | Admitting: *Deleted

## 2022-03-16 MED ORDER — NEOMYCIN-POLYMYXIN-HC 1 % OT SOLN: OTIC | 1 refills | Status: DC

## 2022-03-16 NOTE — Telephone Encounter (Signed)
Patient is calling to ask that the prescription be resent to [facility], insurance problems with the [facility].

## 2022-03-16 NOTE — Progress Notes (Signed)
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  She states that is been removed before.  It seems to be growing back.  It hurts back here she points to the proximal lateral nail fold of the hallux left.  Objective: Vital signs stable alert and oriented x3.  There is a palpable mass but no purulence no malodor there is mild erythema.  It appears to be a small spicule of nail may be growing out with the new nail.  Assessment: Previous matrixectomy with small spicule of nail remaining to the lateral border hallux left.  Plan: At this point chemical matricectomy was performed once again to that fibular border removing the spicule of nail she tolerated procedure well without complications.  She was given both oral written home-going instruction for care and soaking of the toe.  Follow-up with her in about a month to make sure she is healing well.

## 2022-04-11 ENCOUNTER — Ambulatory Visit: Payer: Commercial Managed Care - PPO | Admitting: Podiatry

## 2022-04-11 DIAGNOSIS — L600 Ingrowing nail: Secondary | ICD-10-CM

## 2022-04-12 NOTE — Progress Notes (Signed)
She presents for her nail check of her left fibular hallux today.  States that she denies fever chills nausea vomiting muscle aches pains chills states that it is no longer soaking the toe in Epsom salt she denies pain scab is noted in the lateral border of the hallux left there is no signs or reports of symptoms of infection no erythema edema cellulitis drainage or odor.  Patient was advised to call office if there were any changes or recurrences notify us immediately with any problems.

## 2022-06-27 ENCOUNTER — Ambulatory Visit: Payer: Commercial Managed Care - PPO | Admitting: Obstetrics & Gynecology

## 2022-06-27 ENCOUNTER — Encounter: Payer: Self-pay | Admitting: Obstetrics & Gynecology

## 2022-06-27 VITALS — BP 118/80 | Temp 98.2°F

## 2022-06-27 DIAGNOSIS — R309 Painful micturition, unspecified: Secondary | ICD-10-CM

## 2022-06-27 MED ORDER — SULFAMETHOXAZOLE-TRIMETHOPRIM 800-160 MG PO TABS: 1.0000 | ORAL_TABLET | Freq: Two times a day (BID) | ORAL | 0 refills | Status: AC

## 2022-06-27 NOTE — Progress Notes (Signed)
    [place] [birth year] [id number]        42 y.o.  [id number]  S/P BTL  RP: Dysuria and urinary frequency x yesterday    HPI: Pt c/o pain & burning with urination going to navel, urinary frequency that started yesterday. She also noticed blood in urine.  No fever.  No vaginal discharge.  BMs normal.   OB History  Gravida Para Term Preterm AB Living  5 4 4   1 4   SAB IAB Ectopic Multiple Live Births  1       4    # Outcome Date GA Lbr Len/2nd Weight Sex Delivery Anes PTL Lv  5 Term 05/12/11 [redacted]w[redacted]d  5 lb 9.8 oz (2.545 kg) F CS-LTranv Spinal  LIV  4 SAB 07/06/05 [redacted]w[redacted]d   F  None  DEC  3 Term 02/19/03 [redacted]w[redacted]d   M Vag-Spont None N LIV  2 Term 03/17/99 [redacted]w[redacted]d   F Vag-Spont None N LIV  1 Term 02/18/86 [redacted]w[redacted]d   F Vag-Spont None N LIV    Past medical history,surgical history, problem list, medications, allergies, family history and social history were all reviewed and documented in the EPIC chart.   Directed ROS with pertinent positives and negatives documented in the history of present illness/assessment and plan.  Exam:  Vitals:   06/27/22 1539  BP: 118/80  Temp: 98.2 F (36.8 C)  TempSrc: Oral  SpO2: 99%   General appearance:  Normal  CVAT Negative bilaterally  Abdomen: Normal  Gynecologic exam: Deferred  U/A: Pink, cloudy, Pro 3+, Nit Neg, WBC 20-40, RBC >60, Bacteria Moderate.  Pending U. Culture.   Assessment/Plan:  42 y.o. [id number]   1. Pain with urination Pt c/o pain & burning with urination going to navel, urinary frequency that started yesterday. She also noticed blood in urine.  No fever.  No vaginal discharge.  BMs normal.  U/A c/w an acute Cystitis.  Will treat with Bactrim DS BID x 5 days.  Azo recommended.  Push PO water intake.  Pending U. Culture and Sensitivity. - Urinalysis,Complete w/RFL Culture  Other orders - sulfamethoxazole-trimethoprim (BACTRIM DS) 800-160 MG tablet; Take 1 tablet by mouth 2 (two) times daily for 5 days.   Princess Bruins MD, 3:50 PM 06/27/2022

## 2022-06-29 ENCOUNTER — Telehealth: Payer: Self-pay | Admitting: *Deleted

## 2022-06-29 NOTE — Telephone Encounter (Signed)
Pt was seen 06/27/2022 by Dr. Dellis Filbert. Pt would like an alternative prescription sent into her pharmacy due to ongoing pain with urination. Routing to Provider for recommendations.

## 2022-06-30 LAB — URINALYSIS, COMPLETE W/RFL CULTURE
Bilirubin Urine: NEGATIVE
Glucose, UA: NEGATIVE
Hyaline Cast: NONE SEEN /LPF
Ketones, ur: NEGATIVE
Nitrites, Initial: NEGATIVE
RBC / HPF: >=60 /HPF — AB (ref 0–2)
Specific Gravity, Urine: 1.019 (ref 1.001–1.035)
pH: 7.5 (ref 5.0–8.0)

## 2022-06-30 LAB — URINE CULTURE
MICRO NUMBER:: 14460832
SPECIMEN QUALITY:: ADEQUATE

## 2022-06-30 LAB — CULTURE INDICATED

## 2022-06-30 NOTE — Telephone Encounter (Signed)
Per ML: "Recommend being seen and U/A at Urgent Care or with Fam MD. Dr Dellis Filbert"  Ideally, she should try to wait until after she has completed the antibiotics to have another UA/Ucx done for a more accurate result. Please make sure she is still taking the antibiotic that ML prescribed at her visit. Thanks. Let us know if any additional questions/concerns.

## 2022-07-03 ENCOUNTER — Encounter: Payer: Self-pay | Admitting: Nurse Practitioner

## 2022-07-03 ENCOUNTER — Ambulatory Visit (INDEPENDENT_AMBULATORY_CARE_PROVIDER_SITE_OTHER): Payer: Commercial Managed Care - PPO | Admitting: Nurse Practitioner

## 2022-07-03 VITALS — BP 120/74 | HR 66 | Wt 180.0 lb

## 2022-07-03 DIAGNOSIS — R300 Dysuria: Secondary | ICD-10-CM

## 2022-07-03 DIAGNOSIS — N760 Acute vaginitis: Secondary | ICD-10-CM | POA: Diagnosis not present

## 2022-07-03 DIAGNOSIS — B9689 Other specified bacterial agents as the cause of diseases classified elsewhere: Secondary | ICD-10-CM

## 2022-07-03 LAB — WET PREP FOR TRICH, YEAST, CLUE

## 2022-07-03 MED ORDER — FLUCONAZOLE 150 MG PO TABS: 150.0000 mg | ORAL_TABLET | ORAL | 0 refills | Status: DC

## 2022-07-03 MED ORDER — METRONIDAZOLE 500 MG PO TABS: 500.0000 mg | ORAL_TABLET | Freq: Two times a day (BID) | ORAL | 0 refills | Status: DC

## 2022-07-03 NOTE — Progress Notes (Signed)
   Acute Office Visit  Subjective:    Patient ID: Christy [facility], female    DOB: 1981-01-24, 42 y.o.   MRN: [id number]   HPI 42 y.o. presents today for burning with urination. Treated for UTI with Bactrim x 5 days 06/27/2022. + culture, sensitive to Bactrim. Just finished antibiotic yesterday. Urinary symptoms have improved significantly but burning is still present. Denies vaginal symptoms.    Review of Systems  Constitutional: Negative.   Genitourinary:  Positive for dysuria. Negative for flank pain, frequency, genital sores, hematuria, urgency, vaginal discharge and vaginal pain.       Objective:    Physical Exam Constitutional:      Appearance: Normal appearance.  Genitourinary:    General: Normal vulva.     Vagina: Vaginal discharge present. No erythema.     Cervix: Normal.     BP 120/74   Pulse 66   Wt 180 lb (81.6 kg)   LMP 06/08/2022 (Exact Date) Comment: sexually active-btl  BMI 35.15 kg/m  Wt Readings from Last 3 Encounters:  07/03/22 180 lb (81.6 kg)  01/30/22 176 lb (79.8 kg)  11/10/21 175 lb (79.4 kg)        Patient informed chaperone available to be present for breast and/or pelvic exam. Patient has requested no chaperone to be present. Patient has been advised what will be completed during breast and pelvic exam.   UA: trace leukocytes, negative nitrites, neg blood, negative protein, yellow/cloudy. Microscopic: wbc 0-5, no rbcs, few bacteria  Wet prep + clue cells (+ odor)  Assessment & Plan:   Problem List Items Addressed This Visit   None Visit Diagnoses     Bacterial vaginosis    -  Primary   Relevant Medications   metroNIDAZOLE (FLAGYL) 500 MG tablet      Burning with urination       Relevant Orders   Urinalysis,Complete w/RFL Culture (Completed)   Urine Culture   REFLEXIVE URINE CULTURE (Completed)   WET PREP FOR TRICH, YEAST, CLUE   Acute vaginitis       Relevant Medications   fluconazole (DIFLUCAN) 150 MG tablet       Plan: Wet prep positive for clue cells - Flagyl 500 mg BID x 7 days. Diflucan provided as well. UA unremarkable, culture pending.      Tamela Gammon DNP, 4:24 PM 07/03/2022

## 2022-07-03 NOTE — Telephone Encounter (Signed)
Per Sinclair Grooms: "Done"  Will close encounter.

## 2022-07-05 LAB — URINALYSIS, COMPLETE W/RFL CULTURE
Bilirubin Urine: NEGATIVE
Glucose, UA: NEGATIVE
Hgb urine dipstick: NEGATIVE
Hyaline Cast: NONE SEEN /LPF
Ketones, ur: NEGATIVE
Nitrites, Initial: NEGATIVE
Protein, ur: NEGATIVE
RBC / HPF: NONE SEEN /HPF (ref 0–2)
Specific Gravity, Urine: 1.025 (ref 1.001–1.035)
pH: 6.5 (ref 5.0–8.0)

## 2022-07-05 LAB — URINE CULTURE
MICRO NUMBER:: 14486017
SPECIMEN QUALITY:: ADEQUATE

## 2022-07-05 LAB — CULTURE INDICATED

## 2022-07-06 ENCOUNTER — Other Ambulatory Visit: Payer: Self-pay

## 2022-07-06 MED ORDER — AMOXICILLIN-POT CLAVULANATE 500-125 MG PO TABS: 1.0000 | ORAL_TABLET | Freq: Two times a day (BID) | ORAL | 0 refills | Status: DC

## 2022-11-13 ENCOUNTER — Encounter: Payer: Commercial Managed Care - PPO | Admitting: Nurse Practitioner

## 2022-11-22 ENCOUNTER — Encounter: Payer: Commercial Managed Care - PPO | Admitting: Nurse Practitioner

## 2023-02-01 ENCOUNTER — Ambulatory Visit: Payer: Commercial Managed Care - PPO | Admitting: Obstetrics & Gynecology

## 2023-02-21 ENCOUNTER — Encounter: Payer: Self-pay | Admitting: Radiology

## 2023-02-21 ENCOUNTER — Ambulatory Visit (INDEPENDENT_AMBULATORY_CARE_PROVIDER_SITE_OTHER): Payer: Commercial Managed Care - PPO | Admitting: Radiology

## 2023-02-21 VITALS — BP 112/76 | Ht 60.0 in | Wt 180.0 lb

## 2023-02-21 DIAGNOSIS — Z01419 Encounter for gynecological examination (general) (routine) without abnormal findings: Secondary | ICD-10-CM

## 2023-02-21 NOTE — Progress Notes (Signed)
Christy Graham [facility] 1980-07-17 [id number]   History:  42 y.o. G5P4 presents for annual exam. No gyn concerns.   Gynecologic History Patient's last menstrual period was 01/13/2023 (exact date). Period Cycle (Days): 28 Period Duration (Days): 7 Period Pattern: Regular Menstrual Flow: Heavy, Light, Moderate Menstrual Control: Maxi pad, Thin pad Dysmenorrhea: (!) Moderate Dysmenorrhea Symptoms: Cramping Contraception/Family planning: tubal ligation Sexually active: yes Last Pap: 2023. Results were: normal Last mammogram: 2022. Results were: normal  Obstetric History OB History  Gravida Para Term Preterm AB Living  5 4 4   1 4   SAB IAB Ectopic Multiple Live Births  1       4    # Outcome Date GA Lbr Len/2nd Weight Sex Type Anes PTL Lv  5 Term 05/12/11 [redacted]w[redacted]d  5 lb 9.8 oz (2.545 kg) F CS-LTranv Spinal  LIV  4 SAB 07/06/05 [redacted]w[redacted]d   F  None  DEC  3 Term 02/19/03 [redacted]w[redacted]d   M Vag-Spont None N LIV  2 Term 03/17/99 [redacted]w[redacted]d   F Vag-Spont None N LIV  1 Term 02/18/86 [redacted]w[redacted]d   F Vag-Spont None N LIV     The following portions of the patient's history were reviewed and updated as appropriate: allergies, current medications, past family history, past medical history, past social history, past surgical history, and problem list.  Review of Systems Pertinent items noted in HPI and remainder of comprehensive ROS otherwise negative.   Past medical history, past surgical history, family history and social history were all reviewed and documented in the EPIC chart.   Exam:  Vitals:   02/21/23 1200  BP: 112/76  Weight: 180 lb (81.6 kg)  Height: 5' (1.524 m)   Body mass index is 35.15 kg/m.  General appearance:  Normal Thyroid:  Symmetrical, normal in size, without palpable masses or nodularity. Respiratory  Auscultation:  Clear without wheezing or rhonchi Cardiovascular  Auscultation:  Regular rate, without rubs, murmurs or gallops  Edema/varicosities:  Not grossly  evident Abdominal  Soft,nontender, without masses, guarding or rebound.  Liver/spleen:  No organomegaly noted  Hernia:  None appreciated  Skin  Inspection:  Grossly normal Breasts: Examined lying and sitting.   Right: Without masses, retractions, nipple discharge or axillary adenopathy.   Left: Without masses, retractions, nipple discharge or axillary adenopathy. Genitourinary   Inguinal/mons:  Normal without inguinal adenopathy  External genitalia:  Normal appearing vulva with no masses, tenderness, or lesions  BUS/Urethra/Skene's glands:  Normal without masses or exudate  Vagina:  Normal appearing with normal color and discharge, no lesions  Cervix:  Normal appearing without discharge or lesions  Uterus:  Normal in size, shape and contour.  Mobile, nontender  Adnexa/parametria:     Rt: Normal in size, without masses or tenderness.   Lt: Normal in size, without masses or tenderness.  Anus and perineum: Normal   Raynelle Fanning, CMA present for exam  Assessment/Plan:   1. Well woman exam with routine gynecological exam Pap due 2026 Overdue for mammogram, encouraged to schedule Colonoscopy next year    Discussed SBE, colonoscopy and DEXA screening as directed/appropriate. Recommend of exercise weekly, including weight bearing exercise. Encouraged the use of seatbelts and sunscreen. Return in 1 year for annual or as needed.   Arlie Solomons B WHNP-BC 12:25 PM 02/21/2023

## 2023-03-14 ENCOUNTER — Encounter: Payer: Self-pay | Admitting: Nurse Practitioner

## 2023-03-14 ENCOUNTER — Ambulatory Visit (INDEPENDENT_AMBULATORY_CARE_PROVIDER_SITE_OTHER): Payer: Commercial Managed Care - PPO | Admitting: Nurse Practitioner

## 2023-03-14 VITALS — BP 90/60 | HR 100 | Temp 98.8°F | Ht 60.0 in | Wt 177.0 lb

## 2023-03-14 DIAGNOSIS — Z0000 Encounter for general adult medical examination without abnormal findings: Secondary | ICD-10-CM | POA: Diagnosis not present

## 2023-03-14 DIAGNOSIS — Z6834 Body mass index (BMI) 34.0-34.9, adult: Secondary | ICD-10-CM

## 2023-03-14 DIAGNOSIS — R21 Rash and other nonspecific skin eruption: Secondary | ICD-10-CM

## 2023-03-14 DIAGNOSIS — Z23 Encounter for immunization: Secondary | ICD-10-CM

## 2023-03-14 DIAGNOSIS — E66811 Obesity, class 1: Secondary | ICD-10-CM | POA: Diagnosis not present

## 2023-03-14 DIAGNOSIS — D500 Iron deficiency anemia secondary to blood loss (chronic): Secondary | ICD-10-CM | POA: Diagnosis not present

## 2023-03-14 DIAGNOSIS — E7800 Pure hypercholesterolemia, unspecified: Secondary | ICD-10-CM | POA: Diagnosis not present

## 2023-03-14 DIAGNOSIS — Z1231 Encounter for screening mammogram for malignant neoplasm of breast: Secondary | ICD-10-CM

## 2023-03-14 DIAGNOSIS — E6609 Other obesity due to excess calories: Secondary | ICD-10-CM

## 2023-03-14 MED ORDER — CLOBETASOL PROPIONATE 0.05 % EX OINT
1.0000 | TOPICAL_OINTMENT | Freq: Two times a day (BID) | CUTANEOUS | 0 refills | Status: DC
Start: 2023-03-14 — End: 2024-03-26

## 2023-03-14 NOTE — Assessment & Plan Note (Signed)
Covid 19 vaccine given in office observed for 15 minutes without any adverse reaction  

## 2023-03-14 NOTE — Assessment & Plan Note (Signed)
She is encouraged to strive for BMI less than 30 to decrease cardiac risk. Advised to aim for at least 150 minutes of exercise per week.  

## 2023-03-14 NOTE — Assessment & Plan Note (Signed)
Will check iron studies

## 2023-03-14 NOTE — Assessment & Plan Note (Addendum)
Posterior right hand with hypopigmented macular area to 3rd knuckle area. I will treat with a steroid cream however not sure this will be effective as the area appears to be a hypopigmentation, does not feel raised. Will also refer to Dermatology.

## 2023-03-14 NOTE — Assessment & Plan Note (Signed)
Cholesterol levels are stable, diet controlled, continue focusing on low fat diet

## 2023-03-14 NOTE — Assessment & Plan Note (Signed)
 Behavior modifications discussed and diet history reviewed.   Pt will continue to exercise regularly and modify diet with low GI, plant based foods and decrease intake of processed foods.  Recommend intake of daily multivitamin, Vitamin D, and calcium.  Recommend mammogram for preventive screenings, as well as recommend immunizations that include influenza, TDAP

## 2023-03-14 NOTE — Progress Notes (Signed)
Madelaine Bhat, CMA,acting as a Neurosurgeon for Arnette Felts, FNP.,have documented all relevant documentation on the behalf of Arnette Felts, FNP,as directed by  Arnette Felts, FNP while in the presence of Arnette Felts, FNP.  Subjective:    Patient ID: Christy Graham , female    DOB: 1981-03-26 , 42 y.o.   MRN: [id number]  Chief Complaint  Patient presents with   Annual Exam    HPI  Patient presents today for HM, Patient reports compliance with medication. Patient denies any chest pain, SOB, or headaches. Patients has some bumps on her right hand she reports using OTC medications but it didn't help.  She is here today with the intepreter Maria  BP Readings from Last 3 Encounters: 03/14/23 : 90/60 02/21/23 : 112/76 07/03/22 : 120/74    Rash This is a new problem. The problem has been gradually worsening since onset. The affected locations include the right hand. Rash characteristics: no additional characteristics. She was exposed to nothing. Pertinent negatives include no cough, fatigue or fever. Past treatments include topical steroids. The treatment provided no relief.     Past Medical History:  Diagnosis Date   Anemia      Family History  Problem Relation Age of Onset   Diabetes Mother      Current Outpatient Medications:    clobetasol ointment (TEMOVATE) 0.05 %, Apply 1 Application topically 2 (two) times daily., Disp: 30 g, Rfl: 0   Allergies  Allergen Reactions   Phentermine     rash      The patient states she uses tubal ligation for birth control. Patient's last menstrual period was 03/07/2023 (exact date).. Negative for Menorrhagia and Positive for Dysmenorrhea. Negative for: breast discharge, breast lump(s), breast pain and breast self exam. Associated symptoms include abnormal vaginal bleeding. Pertinent negatives include abnormal bleeding (hematology), anxiety, decreased libido, depression, difficulty falling sleep, dyspareunia, history of infertility,  nocturia, sexual dysfunction, sleep disturbances, urinary incontinence, urinary urgency, vaginal discharge and vaginal itching. Diet regular. She did eat today at 1pm and had eggs.  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 She will walk the dog 1-2 times a day.   The patient's tobacco use is:  Social History   Tobacco Use  Smoking Status Never   Passive exposure: Never  Smokeless Tobacco Never  . She has been exposed to passive smoke. The patient's alcohol use is:  Social History   Substance and Sexual Activity  Alcohol Use Yes   Comment: occ  Additional information: Last pap 01/30/2022, next one scheduled for 01/30/2025.    Review of Systems  Constitutional: Negative.  Negative for fatigue and fever.  HENT: Negative.    Eyes: Negative.   Respiratory: Negative.  Negative for cough.   Cardiovascular: Negative.   Gastrointestinal: Negative.   Endocrine: Negative.   Genitourinary: Negative.   Musculoskeletal: Negative.   Skin:  Positive for rash.  Allergic/Immunologic: Negative.   Neurological: Negative.   Hematological: Negative.   Psychiatric/Behavioral: Negative.       Today's Vitals   03/14/23 1412  BP: 90/60  Pulse: 100  Temp: 98.8 F (37.1 C)  TempSrc: Oral  Weight: 177 lb (80.3 kg)  Height: 5' (1.524 m)  PainSc: 0-No pain   Body mass index is 34.57 kg/m.  Wt Readings from Last 3 Encounters:  03/14/23 177 lb (80.3 kg)  02/21/23 180 lb (81.6 kg)  07/03/22 180 lb (81.6 kg)     Objective:  Physical Exam Vitals reviewed.  Constitutional:      General: She is not in acute distress.    Appearance: Normal appearance. She is well-developed. She is obese.  HENT:     Head: Normocephalic and atraumatic.     Right Ear: Hearing, tympanic membrane, ear canal and external ear normal. There is no impacted cerumen.     Left Ear: Hearing, tympanic membrane, ear canal and external ear normal. There is no impacted cerumen.      Nose: Nose normal.     Mouth/Throat:     Mouth: Mucous membranes are moist.  Eyes:     General: Lids are normal.     Extraocular Movements: Extraocular movements intact.     Conjunctiva/sclera: Conjunctivae normal.     Pupils: Pupils are equal, round, and reactive to light.     Funduscopic exam:    Right eye: No papilledema.        Left eye: No papilledema.  Neck:     Thyroid: No thyroid mass.     Vascular: No carotid bruit.     Comments: Firm mass to left posterior cervical chain.  Cardiovascular:     Rate and Rhythm: Normal rate and regular rhythm.     Pulses: Normal pulses.     Heart sounds: Normal heart sounds. No murmur heard. Pulmonary:     Effort: Pulmonary effort is normal. No respiratory distress.     Breath sounds: Normal breath sounds. No wheezing.  Chest:     Chest wall: No mass.  Breasts:    Tanner Score is 5.     Right: Normal. No mass or tenderness.     Left: Normal. No mass or tenderness.  Abdominal:     General: Abdomen is flat. Bowel sounds are normal. There is no distension.     Palpations: Abdomen is soft.     Tenderness: There is no abdominal tenderness.  Genitourinary:    Comments: Deferred - followed by GYN Musculoskeletal:        General: No swelling or tenderness. Normal range of motion.     Cervical back: Full passive range of motion without pain, normal range of motion and neck supple.     Right lower leg: No edema.     Left lower leg: No edema.  Lymphadenopathy:     Cervical: No cervical adenopathy.     Left cervical: No superficial cervical adenopathy.     Upper Body:     Right upper body: No supraclavicular, axillary or pectoral adenopathy.     Left upper body: No supraclavicular, axillary or pectoral adenopathy.  Skin:    General: Skin is warm and dry.     Capillary Refill: Capillary refill takes less than 2 seconds.     Comments: Right posterior hand has lighter area noted to knuckle area.   Neurological:     General: No focal  deficit present.     Mental Status: She is alert and oriented to person, place, and time.     Cranial Nerves: No cranial nerve deficit.     Sensory: No sensory deficit.     Motor: No weakness.  Psychiatric:        Mood and Affect: Mood normal.        Behavior: Behavior normal.        Thought Content: Thought content normal.        Judgment: Judgment normal.         Assessment And Plan:     Encounter  for annual health examination Assessment & Plan: Behavior modifications discussed and diet history reviewed.   Pt will continue to exercise regularly and modify diet with low GI, plant based foods and decrease intake of processed foods.  Recommend intake of daily multivitamin, Vitamin D, and calcium.  Recommend mammogram for preventive screenings, as well as recommend immunizations that include influenza, TDAP   Orders: -     CBC with Differential/Platelet -     CMP14+EGFR  Need for influenza vaccination Assessment & Plan: Influenza vaccine administered Encouraged to take Tylenol as needed for fever or muscle aches.   Orders: -     Flu vaccine trivalent PF, 6mos and older(Flulaval,Afluria,Fluarix,Fluzone)  COVID-19 vaccine administered Assessment & Plan: Covid 19 vaccine given in office observed for 15 minutes without any adverse reaction   Orders: -     Pfizer Comirnaty Covid-19 Vaccine 55yrs & older  Encounter for screening mammogram for breast cancer -     Digital Screening Mammogram, Left and Right; Future  Class 1 obesity due to excess calories with body mass index (BMI) of 34.0 to 34.9 in adult, unspecified whether serious comorbidity present Assessment & Plan: She is encouraged to strive for BMI less than 30 to decrease cardiac risk. Advised to aim for at least 150 minutes of exercise per week.    Iron deficiency anemia due to chronic blood loss Assessment & Plan: Will check iron studies.  Orders: -     Iron, TIBC and Ferritin Panel  Rash and nonspecific  skin eruption Assessment & Plan: Posterior right hand with hypopigmented macular area to 3rd knuckle area. I will treat with a steroid cream however not sure this will be effective as the area appears to be a hypopigmentation, does not feel raised. Will also refer to Dermatology.   Orders: -     Ambulatory referral to Dermatology -     Clobetasol Propionate; Apply 1 Application topically 2 (two) times daily.  Dispense: 30 g; Refill: 0  Elevated LDL cholesterol level Assessment & Plan: Cholesterol levels are stable, diet controlled, continue focusing on low fat diet  Orders: -     Lipid panel    Used spanish translator on for after visit summary additional instructions  Return for 1 year physical.  Patient was given opportunity to ask questions. Patient verbalized understanding of the plan and was able to repeat key elements of the plan. All questions were answered to their satisfaction.   Arnette Felts, FNP  I, Arnette Felts, FNP, have reviewed all documentation for this visit. The documentation on 03/14/23 for the exam, diagnosis, procedures, and orders are all accurate and complete.

## 2023-03-14 NOTE — Patient Instructions (Addendum)
Meta de hacer ejercicio 150 minutos por semana con al menos 2 das de entrenamiento de fuerza Se les recomienda estacionarse ms lejos cuando estn en la tienda, usar Microbiologist de ascensores y Interior and spatial designer durante los comerciales. Aumente el consumo de agua a al menos un galn de agua al [employer].

## 2023-03-14 NOTE — Assessment & Plan Note (Signed)
Influenza vaccine administered Encouraged to take Tylenol as needed for fever or muscle aches.

## 2023-03-15 LAB — IRON,TIBC AND FERRITIN PANEL
Ferritin: 24 ng/mL (ref 15–150)
Iron Saturation: 18 % (ref 15–55)
Iron: 66 ug/dL (ref 27–159)
Total Iron Binding Capacity: 374 ug/dL (ref 250–450)
UIBC: 308 ug/dL (ref 131–425)

## 2023-03-15 LAB — CBC WITH DIFFERENTIAL/PLATELET
Basophils Absolute: 0 10*3/uL (ref 0.0–0.2)
Basos: 0 %
EOS (ABSOLUTE): 0.1 10*3/uL (ref 0.0–0.4)
Eos: 2 %
Hematocrit: 38.6 % (ref 34.0–46.6)
Hemoglobin: 12 g/dL (ref 11.1–15.9)
Immature Grans (Abs): 0 10*3/uL (ref 0.0–0.1)
Immature Granulocytes: 1 %
Lymphocytes Absolute: 2.1 10*3/uL (ref 0.7–3.1)
Lymphs: 32 %
MCH: 25.5 pg — ABNORMAL LOW (ref 26.6–33.0)
MCHC: 31.1 g/dL — ABNORMAL LOW (ref 31.5–35.7)
MCV: 82 fL (ref 79–97)
Monocytes Absolute: 0.3 10*3/uL (ref 0.1–0.9)
Monocytes: 5 %
Neutrophils Absolute: 3.9 10*3/uL (ref 1.4–7.0)
Neutrophils: 60 %
Platelets: 221 10*3/uL (ref 150–450)
RBC: 4.71 x10E6/uL (ref 3.77–5.28)
RDW: 13.5 % (ref 11.7–15.4)
WBC: 6.5 10*3/uL (ref 3.4–10.8)

## 2023-03-15 LAB — LIPID PANEL
Chol/HDL Ratio: 3.5 {ratio} (ref 0.0–4.4)
Cholesterol, Total: 160 mg/dL (ref 100–199)
HDL: 46 mg/dL (ref >39)
LDL Chol Calc (NIH): 83 mg/dL (ref 0–99)
Triglycerides: 184 mg/dL — ABNORMAL HIGH (ref 0–149)
VLDL Cholesterol Cal: 31 mg/dL (ref 5–40)

## 2023-03-15 LAB — CMP14+EGFR
ALT: 17 [IU]/L (ref 0–32)
AST: 15 [IU]/L (ref 0–40)
Albumin: 4.4 g/dL (ref 3.9–4.9)
Alkaline Phosphatase: 115 [IU]/L (ref 44–121)
BUN/Creatinine Ratio: 20 (ref 9–23)
BUN: 10 mg/dL (ref 6–24)
Bilirubin Total: 0.2 mg/dL (ref 0.0–1.2)
CO2: 24 mmol/L (ref 20–29)
Calcium: 9.5 mg/dL (ref 8.7–10.2)
Chloride: 105 mmol/L (ref 96–106)
Creatinine, Ser: 0.51 mg/dL — ABNORMAL LOW (ref 0.57–1.00)
Globulin, Total: 2.6 g/dL (ref 1.5–4.5)
Glucose: 106 mg/dL — ABNORMAL HIGH (ref 70–99)
Potassium: 3.6 mmol/L (ref 3.5–5.2)
Sodium: 142 mmol/L (ref 134–144)
Total Protein: 7 g/dL (ref 6.0–8.5)
eGFR: 118 mL/min/{1.73_m2} (ref >59)

## 2023-03-20 ENCOUNTER — Ambulatory Visit
Admission: RE | Admit: 2023-03-20 | Discharge: 2023-03-20 | Disposition: A | Discharge status: Home / Self Care | Payer: Commercial Managed Care - PPO | Source: Ambulatory Visit | Attending: Nurse Practitioner | Admitting: Nurse Practitioner

## 2023-03-20 DIAGNOSIS — Z1231 Encounter for screening mammogram for malignant neoplasm of breast: Secondary | ICD-10-CM

## 2023-04-26 IMAGING — US US SOFT TISSUE HEAD/NECK
1 series · 14 of 15 positions shown · non-contrast
Comparison: None Available.

CLINICAL DATA: Left posterior cervical chain lymphadenopathy

EXAM:
ULTRASOUND OF HEAD/NECK SOFT TISSUES
TECHNIQUE: Ultrasound examination of the head and neck soft tissues was
performed in the area of clinical concern.

[Series 1: us soft tissue head/neck · 0.07mm/px · 14 of 15 slices shown]
[im 1/15]
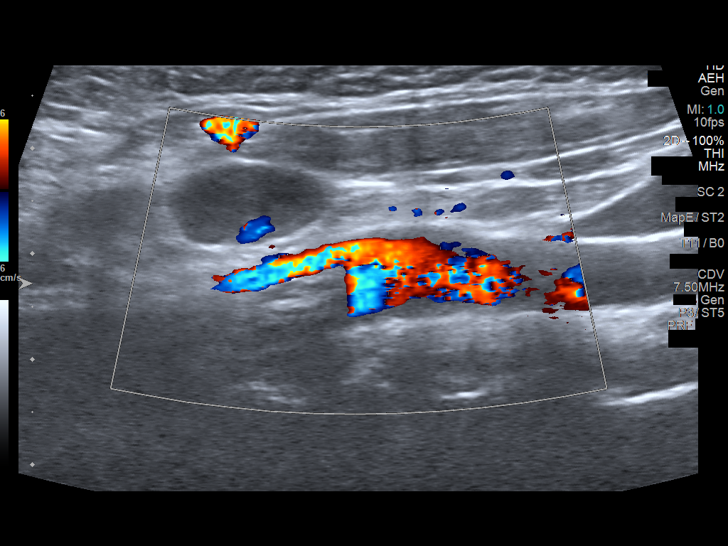
[im 2/15]
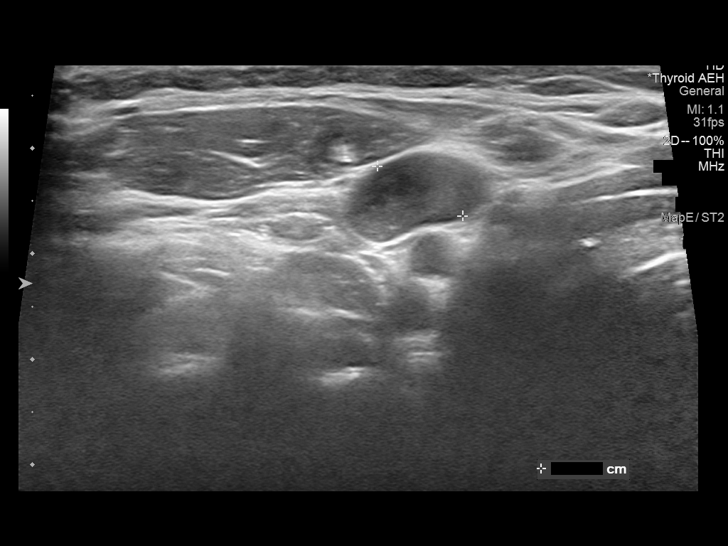
[im 3/15]
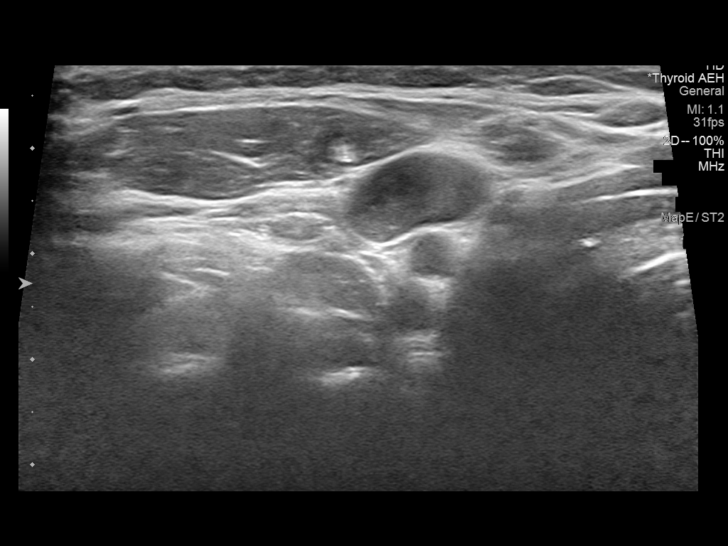
[im 4/15]
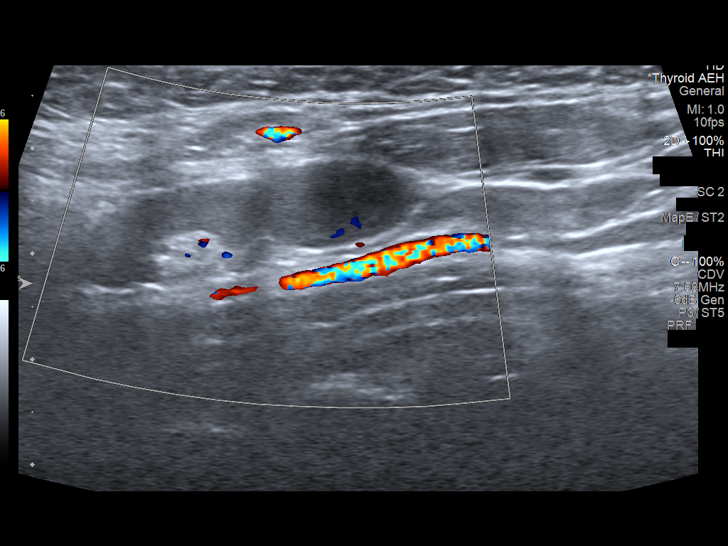
[im 5/15]
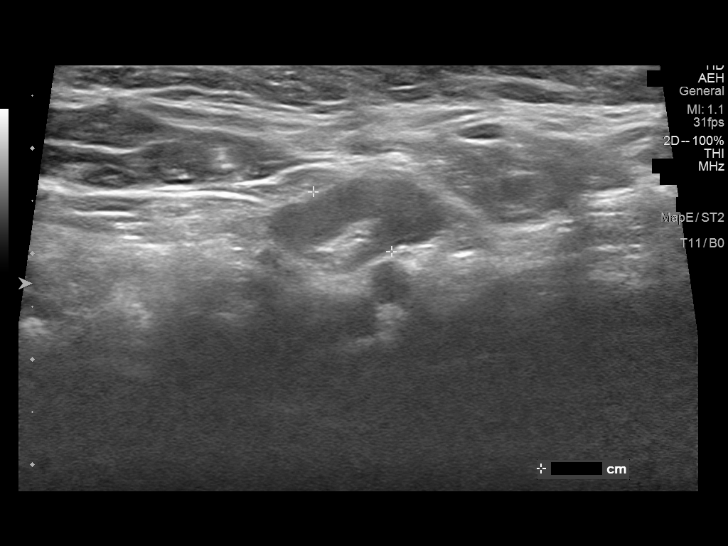
[im 6/15]
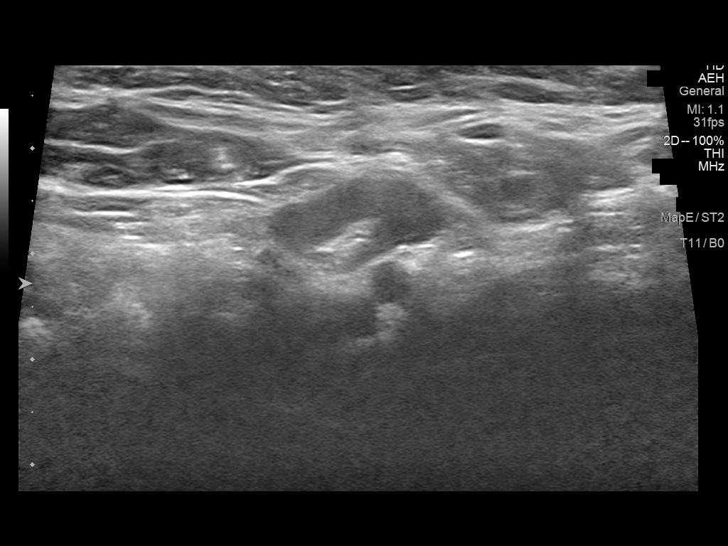
[im 7/15]
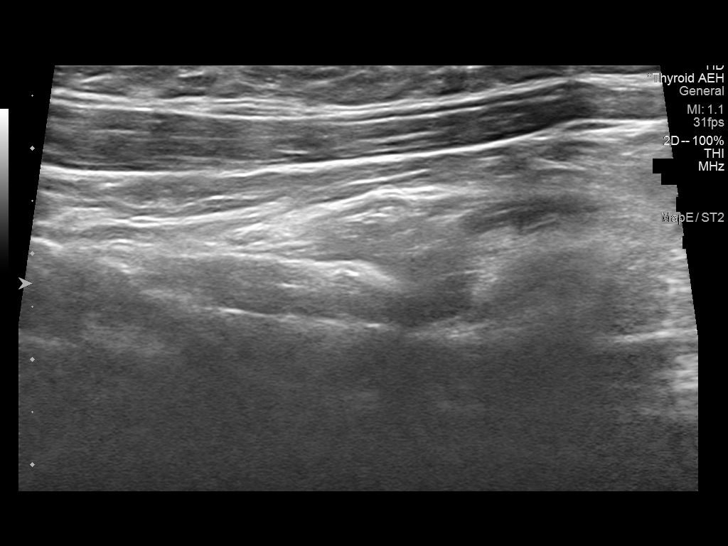
[im 9/15]
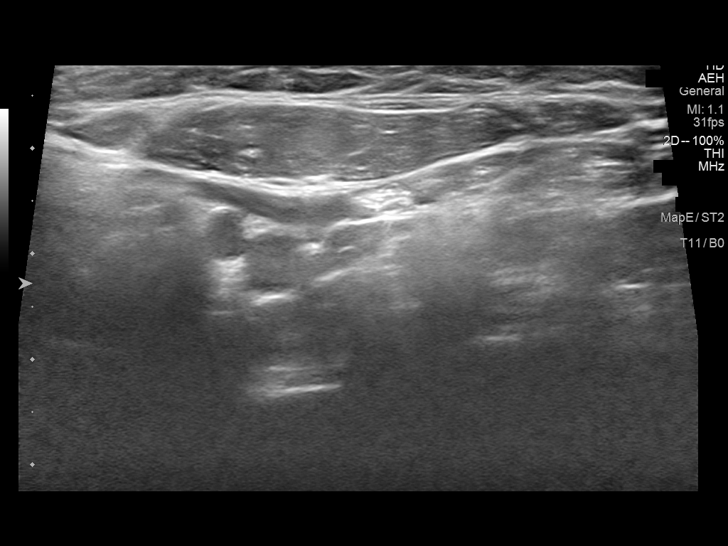
[im 10/15]
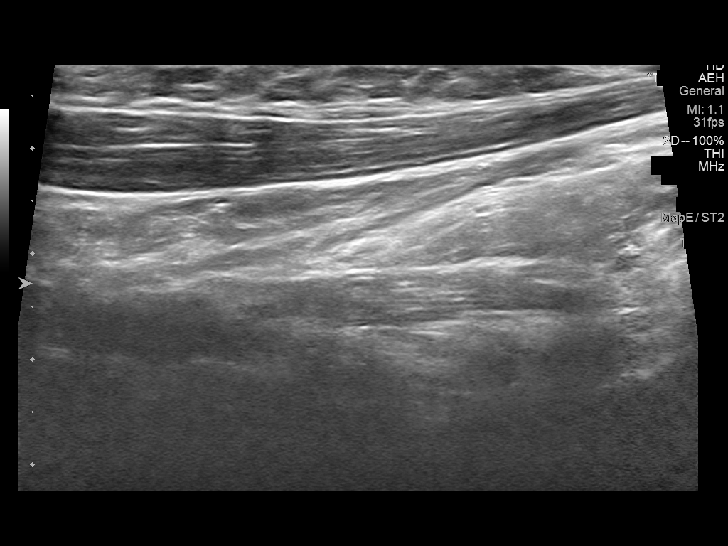
[im 11/15]
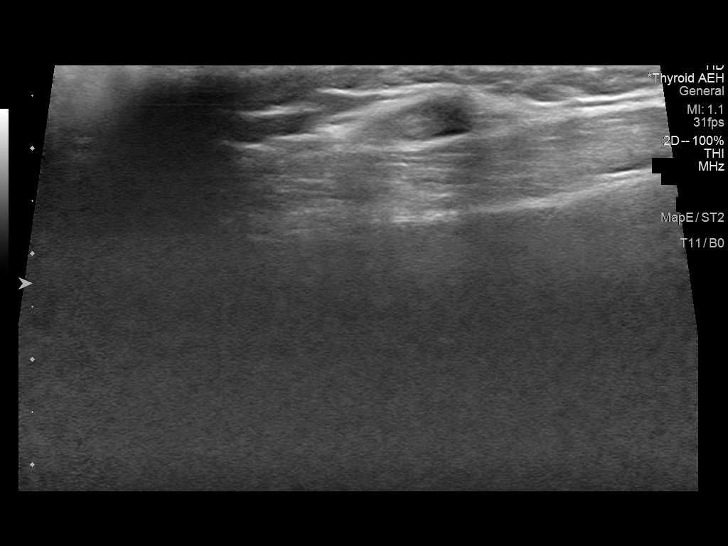
[im 12/15]
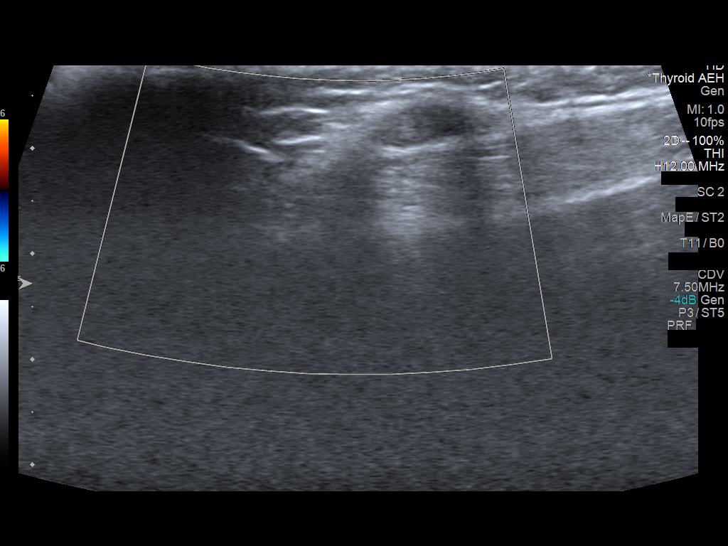
[im 13/15]
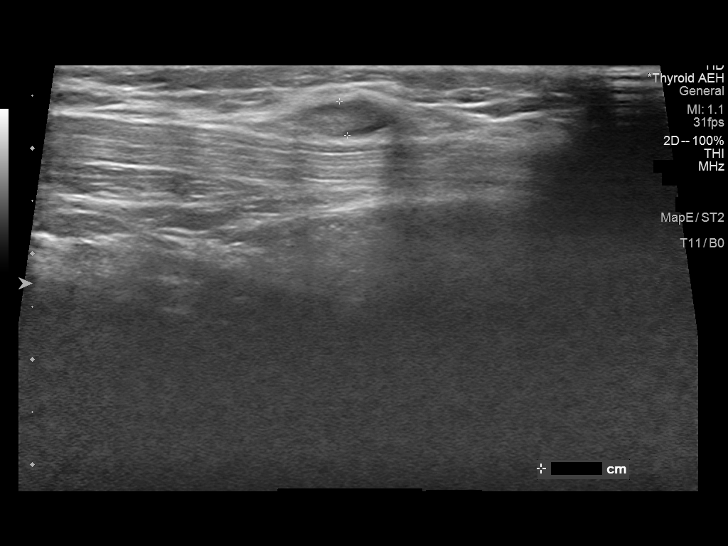
[im 14/15]
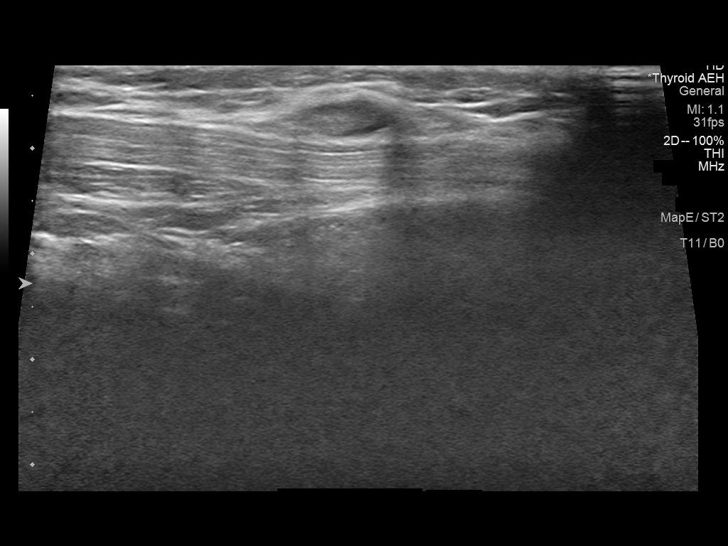
[im 15/15]
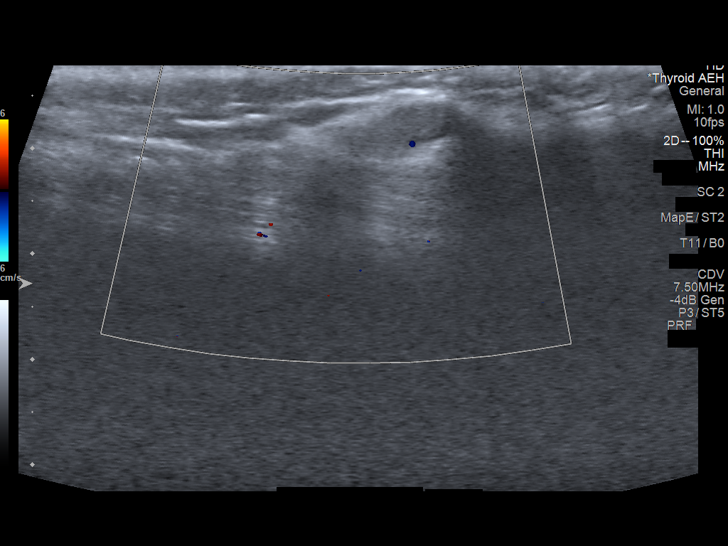

[14 of 15 positions shown; findings below may reference images not displayed]

FINDINGS: In the palpable area of the posterior left neck near the hairline,
there is a small node measuring 3 mm in short axis. Few additional
lateral right neck nodes are identified measuring up to 9 mm in
short axis.
IMPRESSION: Palpable area appears to reflect a small lymph node. No enlarged
nodes identified. Suggest clinical follow-up.

## 2023-10-30 ENCOUNTER — Ambulatory Visit: Payer: Commercial Managed Care - PPO | Admitting: Dermatology

## 2023-10-30 ENCOUNTER — Encounter: Payer: Self-pay | Admitting: Dermatology

## 2023-10-30 VITALS — BP 110/66

## 2023-10-30 DIAGNOSIS — D485 Neoplasm of uncertain behavior of skin: Secondary | ICD-10-CM

## 2023-10-30 DIAGNOSIS — D492 Neoplasm of unspecified behavior of bone, soft tissue, and skin: Secondary | ICD-10-CM | POA: Diagnosis not present

## 2023-10-30 DIAGNOSIS — B079 Viral wart, unspecified: Secondary | ICD-10-CM | POA: Diagnosis not present

## 2023-10-30 MED ORDER — SAFETY SEAL MISCELLANEOUS MISC: 1.0000 | Freq: Every day | 2 refills | Status: DC

## 2023-10-30 NOTE — Progress Notes (Signed)
 New Patient Visit   Subjective  Christy Graham is a 43 y.o. female who presents for the following: Bumps of right hand that came up September of last year. It started as one then spread. She got a wart spray from [facility] [place] and they have stopped growing but aren't going away. She notice a similar spot near her right eye.  Accompanied by interpreter today  The following portions of the chart were reviewed this encounter and updated as appropriate: medications, allergies, medical history  Review of Systems:  No other skin or systemic complaints except as noted in HPI or Assessment and Plan.  Objective  Well appearing patient in no apparent distress; mood and affect are within normal limits.  A focused examination was performed of the following areas: Right hand   Relevant exam findings are noted in the Assessment and Plan.  Right dorsal hand Verrucous papules  Right Lateral Canthus 4 mm pink tan papule   Assessment & Plan   1 . Multiple warts on hands - Assessment:  Patient presents with multiple warts on her hands since September of the previous year. Warts described as growing bumps. Over-the-counter medication has had some effect but not completely resolved the issue. Examination revealed five warts, with more appearing in a linear pattern, suggesting spread due to scratching. Distribution and appearance consistent with viral warts.  - Plan:    Perform in-office cryotherapy with cold spray    Apply Cantharone to treated areas     - Keep covered for 4 hours (until 2:30 PM)     - Wash off after 4 hours    Prescribe Wart Pen (stronger than over-the-counter options)     - To be filled at a specialized pharmacy     - Start application nightly after 5 days    Apply Aquaphor to treated areas for 5 days post-procedure    Patient education:     - Advise on contagious nature of warts     - Instruct to avoid scratching    Schedule follow-up appointment in 6 weeks for  reassessment and potential repeat treatment  2. 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 Does not appear to be a wart based on clinical examination. Further evaluation and possible biopsy warranted to rule out skin cancer, although likely benign.  - Plan:    Refer to Dr. Fain Home, dermatologic surgeon, for biopsy and removal of the lesion    Photograph the lesion for Dr. Shawn Delay review prior to referral    Advise patient not to apply any hand or wart medications near the eye    Await biopsy results to confirm benign nature and exclude skin cancer  VIRAL WARTS, UNSPECIFIED TYPE Right dorsal hand After 1 week, start G pen from [facility] nightly. Destruction of lesion - Right dorsal hand Complexity: simple   Destruction method: cryotherapy   Informed consent: discussed and consent obtained   Timeout:  patient name, date of birth, surgical site, and procedure verified Lesion destroyed using liquid nitrogen: Yes   Region frozen until ice ball extended beyond lesion: Yes   Outcome: patient tolerated procedure well with no complications   Post-procedure details: wound care instructions given    Destruction of lesion - Right dorsal hand  Destruction method: chemical removal   Informed consent: discussed and consent obtained   Timeout:  patient name, date of birth, surgical site, and procedure verified Chemical  destruction method: cantharidin   Procedure instructions: patient instructed to wash and dry area   Outcome: patient tolerated procedure well with no complications   Post-procedure details: wound care instructions given   Additional details:  Advised patient to wash off in 4 hours. 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  I, Eliot Guernsey, CMA, am acting as scribe for [employer], DO .   Documentation: I have reviewed the above documentation for accuracy and completeness, and I agree with the above.  Louana Roup, DO

## 2023-10-30 NOTE — Patient Instructions (Addendum)
 Fecha: [place], 27 [address] [place] de 2025  Estimada [place]:  Gracias por su visita. A continuacin, un resumen de las instrucciones clave:  - Tratamiento de verrugas: - Enjuague el medicamento aplicado a las 14:30 h de hoy (4 horas despus de la aplicacin). - Durante los prximos 5 das, aplique solo Aquaphor en las zonas tratadas. - Despus de [address], comience a Engineer, civil (consulting) de verrugas recetado todas las noches. - Si se forma una ampolla y no duele, retire Conservator, museum/gallery.  - Lesin ocular: - No aplique ningn medicamento cerca de los ojos. - Se ha derivado al Dr. Fain Home para una biopsia de la lesin ocular.  - Seguimiento: - Regrese en 6 semanas para una revisin de Public affairs consultant.  - Instrucciones generales: - Evite rascarse las verrugas para evitar que se extiendan. - Programe una alarma en su telfono a las 14:30 h para recordarle que debe enjuagarse el medicamento.  Si tiene alguna pregunta o inquietud, contctenos.  Atentamente, DraLouana Roup Dermatologa    Date: Tue Oct 30 2023  Dear Adell Hones,  Thank you for visiting today. Here is a summary of the key instructions:  - Wart Treatment:   - Wash off the applied medicine at 2:30 PM today (4 hours after application)   - For the next 5 days, apply only Aquaphor to the treated areas   - After 5 days, start using the prescribed Wart Pen every night   - If a blister forms and doesn't hurt, remove the dead skin  - Eye Lesion:   - Do not apply any medication near the eyes   - A referral has been made to Dr. Paci for a biopsy of the eye lesion  - Follow-up:   - Return in 6 weeks for a wart check-up  - General Instructions:   - Avoid scratching the warts to prevent spreading   - Set an alarm on your phone for 2:30 PM to remind you to wash off the medicine  Please reach out if you have any questions or concerns.  Warm regards,  Dr. Louana Roup Dermatology      Cryotherapy Aftercare  Wash gently with soap and  water everyday.   Apply Vaseline and Band-Aid daily until healed.     Important Information  Due to recent changes in healthcare laws, you may see results of your pathology and/or laboratory studies on MyChart before the doctors have had a chance to review them. We understand that in some cases there may be results that are confusing or concerning to you. Please understand that not all results are received at the same time and often the doctors may need to interpret multiple results in order to provide you with the best plan of care or course of treatment. Therefore, we ask that you please give us  2 business days to thoroughly review all your results before contacting the office for clarification. Should we see a critical lab result, you will be contacted sooner.   If You Need Anything After Your Visit  If you have any questions or concerns for your doctor, please call our main line at [phone] If no one answers, please leave a voicemail as directed and we will return your call as soon as possible. Messages left after 4 pm will be answered the following business day.   You may also send us  a message via MyChart. We typically respond to MyChart messages within 1-2 business days.  For prescription refills, please ask your pharmacy to  contact our office. Our fax number is [phone].  If you have an urgent issue when the clinic is closed that cannot wait until the next business day, you can page your doctor at the number below.    Please note that while we do our best to be available for urgent issues outside of office hours, we are not available 24/7.   If you have an urgent issue and are unable to reach us , you may choose to seek medical care at your doctor's office, retail clinic, urgent care center, or emergency room.  If you have a medical emergency, please immediately call 911 or go to the emergency department. In the event of inclement weather, please call our main line at  [phone] for an update on the status of any delays or closures.  Dermatology Medication Tips: Please keep the boxes that topical medications come in in order to help keep track of the instructions about where and how to use these. Pharmacies typically print the medication instructions only on the boxes and not directly on the medication tubes.   If your medication is too expensive, please contact our office at [phone] or send us  a message through MyChart.   We are unable to tell what your co-pay for medications will be in advance as this is different depending on your insurance coverage. However, we may be able to find a substitute medication at lower cost or fill out paperwork to get insurance to cover a needed medication.   If a prior authorization is required to get your medication covered by your insurance company, please allow us  1-2 business days to complete this process.  Drug prices often vary depending on where the prescription is filled and some pharmacies may offer cheaper prices.  The website www.goodrx.com contains coupons for medications through different pharmacies. The prices here do not account for what the cost may be with help from insurance (it may be cheaper with your insurance), but the website can give you the price if you did not use any insurance.  - You can print the associated coupon and take it with your prescription to the pharmacy.  - You may also stop by our office during regular business hours and pick up a GoodRx coupon card.  - If you need your prescription sent electronically to a different pharmacy, notify our office through [facility] or by phone at [phone]

## 2023-12-12 ENCOUNTER — Ambulatory Visit: Admitting: Dermatology

## 2023-12-12 ENCOUNTER — Encounter: Payer: Self-pay | Admitting: Dermatology

## 2023-12-12 VITALS — BP 105/66 | HR 83

## 2023-12-12 DIAGNOSIS — B079 Viral wart, unspecified: Secondary | ICD-10-CM

## 2023-12-12 NOTE — Patient Instructions (Addendum)

## 2023-12-12 NOTE — Progress Notes (Signed)
   Follow-Up Visit   Subjective  Christy Graham is a 43 y.o. female who presents for the following: Wart  Patient present today for follow up visit for Wart. Patient was last evaluated on 10/30/23. At this visit patient was prescribed Wart Pen and areas were treated with Cantharone. She reports she applies the wart pen nightly. Patient reports sxs are unchanged. Patient denies medication changes.  The following portions of the chart were reviewed this encounter and updated as appropriate: medications, allergies, medical history  Review of Systems:  No other skin or systemic complaints except as noted in HPI or Assessment and Plan.  Objective  Well appearing patient in no apparent distress; mood and affect are within normal limits.  A focused examination was performed of the following areas: Right Hand  Relevant exam findings are noted in the Assessment and Plan.       Right 4th Finger Metacarpophalangeal Joint, Right Proximal 4th Finger Verrucous papules   Assessment & Plan   WART Exam: verrucous papule(s)  Counseling Discussed viral / HPV (Human Papilloma Virus) etiology and risk of spread /infectivity to other areas of body as well as to other people.  Multiple treatments and methods may be required to clear warts and it is possible treatment may not be successful.  Treatment risks include discoloration; scarring and there is still potential for wart recurrence.  Treatment Plan: - Recommended starting OTC Cimetidine daily for 3 months - Cryo Therapy completed while in office today - Cantharidin chemical removal complete while in office, Advised to wash off  - Plan to follow up in 6 weeks to re-assess  VIRAL WARTS, UNSPECIFIED TYPE (2) Right 4th Finger Metacarpophalangeal Joint, Right Proximal 4th Finger Destruction of lesion - Right 4th Finger Metacarpophalangeal Joint, Right Proximal 4th Finger Complexity: simple   Destruction method: cryotherapy   Informed  consent: discussed and consent obtained   Timeout:  patient name, date of birth, surgical site, and procedure verified Lesion destroyed using liquid nitrogen: Yes   Post-procedure details: wound care instructions given    Destruction of lesion - Right 4th Finger Metacarpophalangeal Joint, Right Proximal 4th Finger Complexity: simple   Destruction method: chemical removal   Timeout:  patient name, date of birth, surgical site, and procedure verified Chemical destruction method: cantharidin   Application time:  2 seconds Procedure instructions: patient instructed to wash and dry area   Outcome: patient tolerated procedure well with no complications     Return in about 6 weeks (around 01/23/2024) for Wart F/U.  I, Jetta Ager, am acting as Neurosurgeon for [employer], DO.  Documentation: I have reviewed the above documentation for accuracy and completeness, and I agree with the above.  Delon Lenis, DO

## 2024-01-23 ENCOUNTER — Ambulatory Visit: Admitting: Dermatology

## 2024-01-31 ENCOUNTER — Encounter: Payer: Self-pay | Admitting: Dermatology

## 2024-02-07 ENCOUNTER — Encounter: Payer: Self-pay | Admitting: Dermatology

## 2024-02-07 ENCOUNTER — Ambulatory Visit: Admitting: Dermatology

## 2024-02-07 VITALS — BP 129/76 | HR 82

## 2024-02-07 DIAGNOSIS — D23112 Other benign neoplasm of skin of right lower eyelid, including canthus: Secondary | ICD-10-CM

## 2024-02-07 DIAGNOSIS — D485 Neoplasm of uncertain behavior of skin: Secondary | ICD-10-CM

## 2024-02-07 DIAGNOSIS — D224 Melanocytic nevi of scalp and neck: Secondary | ICD-10-CM | POA: Diagnosis not present

## 2024-02-07 DIAGNOSIS — D492 Neoplasm of unspecified behavior of bone, soft tissue, and skin: Secondary | ICD-10-CM | POA: Diagnosis not present

## 2024-02-07 DIAGNOSIS — D239 Other benign neoplasm of skin, unspecified: Secondary | ICD-10-CM

## 2024-02-07 NOTE — Progress Notes (Signed)
   New Patient Visit   Subjective  Christy Graham is a 43 y.o. female who presents for the following: Here for treatment and removal of a lesion on right eye. She is accompanied by a [place] Interpreter  She also has a lesion of concern on her right posterior neck, present for several years, not growing or changing, not previously treated.   The following portions of the chart were reviewed this encounter and updated as appropriate: medications, allergies, medical history  Review of Systems:  No other skin or systemic complaints except as noted in HPI or Assessment and Plan.  Objective  Well appearing patient in no apparent distress; mood and affect are within normal limits.  A focused examination was performed of the following areas: Right eye Right neck  Relevant exam findings are noted in the Assessment and Plan.  Right Lateral Canthus 5 mm fluid filled vesicle  Right Posterior Neck 5 mm fleshy brown papule   Assessment & Plan     NEOPLASM OF UNCERTAIN BEHAVIOR OF SKIN (2) Right Lateral Canthus Skin excision  Excision method:  elliptical Lesion length (cm):  0.6 Lesion width (cm):  0 Margin per side (cm):  0.1 Total excision diameter (cm):  0.8 Informed consent: discussed and consent obtained   Timeout: patient name, date of birth, surgical site, and procedure verified   Procedure prep:  Patient was prepped and draped in usual sterile fashion Prep type:  Isopropyl alcohol Anesthesia: the lesion was anesthetized in a standard fashion   Anesthetic:  1% lidocaine  w/ epinephrine 1-100,000 buffered w/ 8.4% NaHCO3 Instrument used: scissors   Hemostasis achieved with: pressure and electrodesiccation   Outcome: patient tolerated procedure well with no complications   Post-procedure details: sterile dressing applied and wound care instructions given   Dressing type: petrolatum and bandage    Specimen 1 - Surgical pathology Differential Diagnosis: r/o hidrocystoma  vs other  Check Margins: No Right Posterior Neck Skin / nail biopsy Type of biopsy: tangential   Informed consent: discussed and consent obtained   Timeout: patient name, date of birth, surgical site, and procedure verified   Procedure prep:  Patient was prepped and draped in usual sterile fashion Prep type:  Isopropyl alcohol Anesthesia: the lesion was anesthetized in a standard fashion   Anesthetic:  1% lidocaine  w/ epinephrine 1-100,000 buffered w/ 8.4% NaHCO3 Instrument used: scissors   Hemostasis achieved with: pressure and electrodesiccation   Outcome: patient tolerated procedure well   Post-procedure details: sterile dressing applied and wound care instructions given   Dressing type: petrolatum gauze and bandage    Specimen 2 - Surgical pathology Differential Diagnosis: r/o DN vs other  Check Margins: No HIDROCYSTOMA    Return if symptoms worsen or fail to improve.  I, Berwyn Lesches, Surg Tech III, am acting as scribe for RUFUS CHRISTELLA HOLY, MD.   Documentation: I have reviewed the above documentation for accuracy and completeness, and I agree with the above.  RUFUS CHRISTELLA HOLY, MD

## 2024-02-08 LAB — SURGICAL PATHOLOGY

## 2024-02-10 ENCOUNTER — Ambulatory Visit: Payer: Self-pay | Admitting: Dermatology

## 2024-02-12 ENCOUNTER — Telehealth: Payer: Self-pay

## 2024-02-12 NOTE — Telephone Encounter (Signed)
 Spoke with pt gave her bx results and recommendation

## 2024-02-22 ENCOUNTER — Encounter: Payer: Self-pay | Admitting: Radiology

## 2024-02-22 ENCOUNTER — Ambulatory Visit (INDEPENDENT_AMBULATORY_CARE_PROVIDER_SITE_OTHER): Payer: Commercial Managed Care - PPO | Admitting: Radiology

## 2024-02-22 VITALS — BP 106/72 | HR 87 | Ht 61.0 in | Wt 178.0 lb

## 2024-02-22 DIAGNOSIS — Z01419 Encounter for gynecological examination (general) (routine) without abnormal findings: Secondary | ICD-10-CM

## 2024-02-22 DIAGNOSIS — N920 Excessive and frequent menstruation with regular cycle: Secondary | ICD-10-CM

## 2024-02-22 DIAGNOSIS — Z1331 Encounter for screening for depression: Secondary | ICD-10-CM | POA: Diagnosis not present

## 2024-02-22 MED ORDER — TRANEXAMIC ACID 650 MG PO TABS: 1300.0000 mg | ORAL_TABLET | Freq: Three times a day (TID) | ORAL | 6 refills | Status: AC

## 2024-02-22 NOTE — Progress Notes (Signed)
 Christy Graham [birth year] [id number]   History:  43 y.o. accompanied by Spanish Interpreter G5P4 presents for annual exam.c/o heavy, long periods. Would like some medication to help with this.  Gynecologic History Patient's last menstrual period was 02/11/2024 (approximate). Period Cycle (Days): 28 Period Duration (Days): 7-8 Period Pattern: Regular Menstrual Flow: Moderate, Heavy Menstrual Control: Thin pad, Maxi pad Dysmenorrhea: (!) Moderate (severe if heavy periods) Dysmenorrhea Symptoms: Cramping Contraception/Family planning: tubal ligation Sexually active: yes Last Pap: 2023. Results were: normal Last mammogram: 03/2023. Results were: normal  Obstetric History OB History  Gravida Para Term Preterm AB Living  5 4 4  1 4   SAB IAB Ectopic Multiple Live Births  1    4    # Outcome Date GA Lbr Len/2nd Weight Sex Type Anes PTL Lv  5 Term 05/12/11 [redacted]w[redacted]d  5 lb 9.8 oz (2.545 kg) F CS-LTranv Spinal  LIV  4 SAB 07/06/05 [redacted]w[redacted]d   F  None  DEC  3 Term 02/19/03 [redacted]w[redacted]d   M Vag-Spont None N LIV  2 Term 03/17/99 [redacted]w[redacted]d   F Vag-Spont None N LIV  1 Term 02/18/86 [redacted]w[redacted]d   F Vag-Spont None N LIV       02/22/2024   10:14 AM 03/14/2023    2:11 PM 11/10/2021    9:30 AM  Depression screen PHQ 2/9  Decreased Interest 0 0 0  Down, Depressed, Hopeless 0 0 0  PHQ - 2 Score 0 0 0  Altered sleeping  0 0  Tired, decreased energy  0 0  Change in appetite  0 0  Feeling bad or failure about yourself   0 0  Trouble concentrating  0 0  Moving slowly or fidgety/restless  0 0  Suicidal thoughts  0 0  PHQ-9 Score  0 0  Difficult doing work/chores  Not difficult at all     The following portions of the patient's history were reviewed and updated as appropriate: allergies, current medications, past family history, past medical history, past social history, past surgical history, and problem list.  Review of Systems Pertinent items noted in HPI and remainder of comprehensive ROS otherwise  negative.   Past medical history, past surgical history, family history and social history were all reviewed and documented in the EPIC chart.   Exam:  Vitals:   02/22/24 1012  BP: 106/72  Pulse: 87  SpO2: 97%  Weight: 178 lb (80.7 kg)  Height: 5' 1 (1.549 m)    Body mass index is 33.63 kg/m.  General appearance:  Normal Thyroid :  Symmetrical, normal in size, without palpable masses or nodularity. Respiratory  Auscultation:  Clear without wheezing or rhonchi Cardiovascular  Auscultation:  Regular rate, without rubs, murmurs or gallops  Edema/varicosities:  Not grossly evident Abdominal  Soft,nontender, without masses, guarding or rebound.  Liver/spleen:  No organomegaly noted  Hernia:  None appreciated  Skin  Inspection:  Grossly normal Breasts: Examined lying and sitting.   Right: Without masses, retractions, nipple discharge or axillary adenopathy.   Left: Without masses, retractions, nipple discharge or axillary adenopathy. Genitourinary   Inguinal/mons:  Normal without inguinal adenopathy  External genitalia:  Normal appearing vulva with no masses, tenderness, or lesions  BUS/Urethra/Skene's glands:  Normal without masses or exudate  Vagina:  Normal appearing with normal color and discharge, no lesions  Cervix:  Normal appearing without discharge or lesions  Uterus:  Normal in size, shape and contour.  Mobile, nontender  Adnexa/parametria:     Rt: Normal in  size, without masses or tenderness.   Lt: Normal in size, without masses or tenderness.  Anus and perineum: Normal   Christy Graham, CMA present for exam  Assessment/Plan:   1. Well woman exam with routine gynecological exam (Primary) Pap due 2026 Mammogram due 10/25 Colonoscopy due  2. Menorrhagia with regular cycle - tranexamic acid  (LYSTEDA ) 650 MG TABS tablet; Take 2 tablets (1,300 mg total) by mouth 3 (three) times daily for 5 days.  Dispense: 30 tablet; Refill: 6  3. Depression screen    Return  in 1 year for annual or as needed.   Christy Graham B WHNP-BC 10:26 AM 02/22/2024

## 2024-03-07 ENCOUNTER — Encounter: Payer: Self-pay | Admitting: Radiology

## 2024-03-07 ENCOUNTER — Ambulatory Visit: Admitting: Radiology

## 2024-03-07 VITALS — BP 122/80 | Temp 98.4°F

## 2024-03-07 DIAGNOSIS — N3000 Acute cystitis without hematuria: Secondary | ICD-10-CM

## 2024-03-07 DIAGNOSIS — N920 Excessive and frequent menstruation with regular cycle: Secondary | ICD-10-CM

## 2024-03-07 MED ORDER — SULFAMETHOXAZOLE-TRIMETHOPRIM 800-160 MG PO TABS: 1.0000 | ORAL_TABLET | Freq: Two times a day (BID) | ORAL | 0 refills | Status: DC

## 2024-03-07 MED ORDER — TRANEXAMIC ACID 650 MG PO TABS: 1300.0000 mg | ORAL_TABLET | Freq: Three times a day (TID) | ORAL | 6 refills | Status: AC

## 2024-03-07 NOTE — Progress Notes (Signed)
      SUBJECTIVE: Christy Graham is a 43 y.o. female who complains of burning with urination, frequency, urgency. Symptoms started this morning. Did not pick up lysteda  after last visit. Needs it resent. Video spanish interpreter used for visit.  Allergies  Allergen Reactions   Phentermine      rash    Current Outpatient Medications on File Prior to Visit  Medication Sig Dispense Refill   clobetasol  ointment (TEMOVATE ) 0.05 % Apply 1 Application topically 2 (two) times daily. 30 g 0   Safety Seal Miscellaneous MISC Apply 1 application  topically at bedtime. Medication name - G Pen (Patient not taking: Reported on 02/22/2024) 1 each 2   [DISCONTINUED] gabapentin  (NEURONTIN ) 300 MG capsule Take 1 tablet at bedtime x 1 week, then increase to 1 tablet in morning and bedtime. 60 capsule 5   No current facility-administered medications on file prior to visit.    Past Medical History:  Diagnosis Date   Anemia      OBJECTIVE: Appears well, in no apparent distress.  Vital signs are normal. The abdomen is soft without tenderness, guarding, mass, rebound or organomegaly. No CVA tenderness or inguinal adenopathy noted. Urine dipstick shows positive for RBC's, positive for protein, and positive for leukocytes.  Micro exam: + WBC's per HPF, + RBC's per HPF, and + bacteria.    Last menstrual period 02/11/2024.    Chaperone offered and declined for exam.  ASSESSMENT/PLAN: 1. Acute cystitis without hematuria (Primary) - Urinalysis,Complete w/RFL Culture - sulfamethoxazole -trimethoprim  (BACTRIM  DS) 800-160 MG tablet; Take 1 tablet by mouth 2 (two) times daily.  Dispense: 6 tablet; Refill: 0  2. Menorrhagia with regular cycle -tranexamic acid  (LYSTEDA ) 650 MG TABS tablet; Take 2 tablets (1,300 mg total) by mouth 3 (three) times daily for 5 days.  Dispense: 30 tablet; Refill: 6   Will send urine culture and sensitivity.  Treatment per orders - also push fluids, avoid bladder irritants.  Instructed she may use Pyridium  OTC prn. Call or return to clinic prn if these symptoms worsen or fail to improve as anticipated. Pyelo precautions reviewed with patient.   Parker Wherley B, NP 11:28 AM

## 2024-03-09 LAB — URINALYSIS, COMPLETE W/RFL CULTURE
Bilirubin Urine: NEGATIVE
Glucose, UA: NEGATIVE
Hyaline Cast: NONE SEEN /LPF
Ketones, ur: NEGATIVE
Nitrites, Initial: NEGATIVE
Specific Gravity, Urine: 1.015 (ref 1.001–1.035)
pH: 6 (ref 5.0–8.0)

## 2024-03-09 LAB — CULTURE INDICATED

## 2024-03-09 LAB — URINE CULTURE
MICRO NUMBER:: 17053676
Result:: NO GROWTH
SPECIMEN QUALITY:: ADEQUATE

## 2024-03-10 ENCOUNTER — Ambulatory Visit: Payer: Self-pay | Admitting: Radiology

## 2024-03-17 ENCOUNTER — Encounter: Payer: Commercial Managed Care - PPO | Admitting: Nurse Practitioner

## 2024-03-17 NOTE — Progress Notes (Deleted)
 LILLETTE Kristeen JINNY Gladis, CMA,acting as a Neurosurgeon for Gaines Ada, FNP.,have documented all relevant documentation on the behalf of Gaines Ada, FNP,as directed by  Gaines Ada, FNP while in the presence of Gaines Ada, FNP.  Subjective:    Patient ID: Christy Graham , female    DOB: 11/30/1980 , 43 y.o.   MRN: [id number]  No chief complaint on file.   HPI  HPI   Past Medical History:  Diagnosis Date   Anemia      Family History  Problem Relation Age of Onset   Diabetes Mother      Current Outpatient Medications:    clobetasol  ointment (TEMOVATE ) 0.05 %, Apply 1 Application topically 2 (two) times daily., Disp: 30 g, Rfl: 0   Safety Seal Miscellaneous MISC, Apply 1 application  topically at bedtime. Medication name - G Pen (Patient not taking: Reported on 03/07/2024), Disp: 1 each, Rfl: 2   sulfamethoxazole -trimethoprim  (BACTRIM  DS) 800-160 MG tablet, Take 1 tablet by mouth 2 (two) times daily., Disp: 6 tablet, Rfl: 0   Allergies  Allergen Reactions   Phentermine      rash      The patient states she uses {contraceptive methods:5051} for birth control. Patient's last menstrual period was 03/07/2024 (exact date).. {Dysmenorrhea-menorrhagia:21918}. Negative for: breast discharge, breast lump(s), breast pain and breast self exam. Associated symptoms include abnormal vaginal bleeding. Pertinent negatives include abnormal bleeding (hematology), anxiety, decreased libido, depression, difficulty falling sleep, dyspareunia, history of infertility, nocturia, sexual dysfunction, sleep disturbances, urinary incontinence, urinary urgency, vaginal discharge and vaginal itching. Diet regular.The patient states her exercise level is    . The patient's tobacco use is:  Social History   Tobacco Use  Smoking Status Never   Passive exposure: Never  Smokeless Tobacco Never  . She has been exposed to passive smoke. The patient's alcohol use is:  Social History   Substance and Sexual Activity   Alcohol Use Yes   Comment: occasional  . Additional information: Last pap ***, next one scheduled for ***.    Review of Systems   There were no vitals filed for this visit. There is no height or weight on file to calculate BMI.  Wt Readings from Last 3 Encounters:  02/22/24 178 lb (80.7 kg)  03/14/23 177 lb (80.3 kg)  02/21/23 180 lb (81.6 kg)     Objective:  Physical Exam      Assessment And Plan:     Encounter for annual health examination  Elevated LDL cholesterol level  Iron deficiency anemia due to chronic blood loss     No follow-ups on file. Patient was given opportunity to ask questions. Patient verbalized understanding of the plan and was able to repeat key elements of the plan. All questions were answered to their satisfaction.   Gaines Ada, FNP  I, Gaines Ada, FNP, have reviewed all documentation for this visit. The documentation on 03/17/24 for the exam, diagnosis, procedures, and orders are all accurate and complete.

## 2024-03-26 ENCOUNTER — Ambulatory Visit: Admitting: Family Medicine

## 2024-03-26 ENCOUNTER — Encounter: Payer: Self-pay | Admitting: Family Medicine

## 2024-03-26 VITALS — BP 110/70 | HR 87 | Temp 98.2°F | Ht 61.0 in | Wt 181.0 lb

## 2024-03-26 DIAGNOSIS — F5101 Primary insomnia: Secondary | ICD-10-CM | POA: Diagnosis not present

## 2024-03-26 DIAGNOSIS — Z1211 Encounter for screening for malignant neoplasm of colon: Secondary | ICD-10-CM | POA: Insufficient documentation

## 2024-03-26 DIAGNOSIS — E66811 Obesity, class 1: Secondary | ICD-10-CM

## 2024-03-26 DIAGNOSIS — Z23 Encounter for immunization: Secondary | ICD-10-CM | POA: Diagnosis not present

## 2024-03-26 DIAGNOSIS — Z0000 Encounter for general adult medical examination without abnormal findings: Secondary | ICD-10-CM | POA: Diagnosis not present

## 2024-03-26 DIAGNOSIS — E6609 Other obesity due to excess calories: Secondary | ICD-10-CM

## 2024-03-26 DIAGNOSIS — Z833 Family history of diabetes mellitus: Secondary | ICD-10-CM

## 2024-03-26 DIAGNOSIS — Z6834 Body mass index (BMI) 34.0-34.9, adult: Secondary | ICD-10-CM

## 2024-03-26 DIAGNOSIS — Z136 Encounter for screening for cardiovascular disorders: Secondary | ICD-10-CM

## 2024-03-26 LAB — CBC
Hematocrit: 39.3 % (ref 34.0–46.6)
Hemoglobin: 12.6 g/dL (ref 11.1–15.9)
MCH: 26.1 pg — ABNORMAL LOW (ref 26.6–33.0)
MCHC: 32.1 g/dL (ref 31.5–35.7)
MCV: 81 fL (ref 79–97)
Platelets: 267 x10E3/uL (ref 150–450)
RBC: 4.83 x10E6/uL (ref 3.77–5.28)
RDW: 13.6 % (ref 11.7–15.4)
WBC: 7.6 x10E3/uL (ref 3.4–10.8)

## 2024-03-26 LAB — LIPID PANEL
Chol/HDL Ratio: 3.8 ratio (ref 0.0–4.4)
Cholesterol, Total: 195 mg/dL (ref 100–199)
HDL: 52 mg/dL (ref >39)
LDL Chol Calc (NIH): 121 mg/dL — ABNORMAL HIGH (ref 0–99)
Triglycerides: 125 mg/dL (ref 0–149)
VLDL Cholesterol Cal: 22 mg/dL (ref 5–40)

## 2024-03-26 LAB — CMP14+EGFR
ALT: 20 IU/L (ref 0–32)
AST: 17 IU/L (ref 0–40)
Albumin: 4.4 g/dL (ref 3.9–4.9)
Alkaline Phosphatase: 122 IU/L — ABNORMAL HIGH (ref 41–116)
BUN/Creatinine Ratio: 18 (ref 9–23)
BUN: 10 mg/dL (ref 6–24)
Bilirubin Total: 0.4 mg/dL (ref 0.0–1.2)
CO2: 22 mmol/L (ref 20–29)
Calcium: 9.2 mg/dL (ref 8.7–10.2)
Chloride: 104 mmol/L (ref 96–106)
Creatinine, Ser: 0.57 mg/dL (ref 0.57–1.00)
Globulin, Total: 2.9 g/dL (ref 1.5–4.5)
Glucose: 85 mg/dL (ref 70–99)
Potassium: 4.4 mmol/L (ref 3.5–5.2)
Sodium: 137 mmol/L (ref 134–144)
Total Protein: 7.3 g/dL (ref 6.0–8.5)
eGFR: 114 mL/min/1.73 (ref >59)

## 2024-03-26 LAB — HEMOGLOBIN A1C
Est. average glucose Bld gHb Est-mCnc: 111 mg/dL
Hgb A1c MFr Bld: 5.5 % (ref 4.8–5.6)

## 2024-03-26 MED ORDER — TRAZODONE HCL 50 MG PO TABS: 50.0000 mg | ORAL_TABLET | Freq: Every evening | ORAL | 1 refills | Status: AC | PRN

## 2024-03-26 NOTE — Patient Instructions (Signed)

## 2024-03-26 NOTE — Progress Notes (Signed)
 I,Jameka J Llittleton, CMA,acting as a neurosurgeon for [employer], NP.,have documented all relevant documentation on the behalf of Bruna Creighton, NP,as directed by  Bruna Creighton, NP while in the presence of Bruna Creighton, NP.  Subjective:    Patient ID: Christy Graham , female    DOB: 06-17-1980 , 43 y.o.   MRN: [id number]  Chief Complaint  Patient presents with   Annual Exam    Patient presents today for a physical. Patient reports she has been having trouble sleeping, she reports she tosses and turns a lot. She has never tried any meds in the past for sleep. Patient would like for her cortisol levels to be checked.    HPI Discussed the use of AI scribe software for clinical note transcription with the patient, who gave verbal consent to proceed.  History of Present Illness  Christy Graham is a 43 year old female who presents for her annual physical. She is here with a Spanish interpreter Silvia Dines with sleep disturbances.  She has been experiencing difficulty falling asleep and frequent awakenings during the night, occurring four to five times per night over the past two months. She typically sleeps from 10:30 PM to 6:00 AM, totaling approximately eight hours, but does not feel rested. No need to use the bathroom during these awakenings, which she describes as occurring 'just out of nowhere'.  She mentions a conversation with her gynecologist who suggested that high cortisol levels might be contributing to her sleep issues. However, she is unsure about the connection between cortisol and sleep disturbances. She has not had cortisol levels checked as part of her routine blood work.  No feelings of nervousness, anxiety, or difficulty controlling worries. She acknowledges that her job might be a source of stress.  She has a family history of diabetes, with her mother being affected. She wants to have her blood sugar levels checked due to this family history.   [name] is  a 43 year old female who presents for her annual physical. She is here with a Spanish interpreter Silvia Dines with complains sleep disturbances.  She has been experiencing difficulty falling asleep and frequent awakenings during the night, occurring four to five times per night over the past two months. She typically sleeps from 10:30 PM to 6:00 AM, totaling approximately eight hours, but does not feel rested. No need to use the bathroom during these awakenings, which she describes as occurring 'just out of nowhere'.  She mentions a conversation with her gynecologist who suggested that high cortisol levels might be contributing to her sleep issues. However, she is unsure about the connection between cortisol and sleep disturbances. She has not had cortisol levels checked as part of her routine blood work.  No feelings of nervousness, anxiety, or difficulty controlling worries. She acknowledges that her job might be a source of stress.  She has a family history of diabetes, with her mother being affected. She wants to have her blood sugar levels checked due to this family history.             Past Medical History:  Diagnosis Date   Anemia      Family History  Problem Relation Age of Onset   Diabetes Mother      Current Outpatient Medications:    traZODone (DESYREL) 50 MG tablet, Take 1 tablet (50 mg total) by mouth at bedtime as needed for sleep., Disp: 30 tablet, Rfl: 1   Allergies  Allergen Reactions  Phentermine      rash       Social History   Tobacco Use  Smoking Status Never   Passive exposure: Never  Smokeless Tobacco Never   Social History   Substance and Sexual Activity  Alcohol Use Yes   Comment: occasional    Review of Systems  Constitutional: Negative.   HENT: Negative.    Eyes: Negative.   Respiratory: Negative.    Cardiovascular: Negative.   Gastrointestinal: Negative.   Endocrine: Negative.   Genitourinary: Negative.   Musculoskeletal:  Negative.   Skin: Negative.   Allergic/Immunologic: Negative.   Neurological: Negative.   Hematological: Negative.   Psychiatric/Behavioral:  Positive for sleep disturbance.      Today's Vitals   03/26/24 1006  BP: 110/70  Pulse: 87  Temp: 98.2 F (36.8 C)  TempSrc: Oral  Weight: 181 lb (82.1 kg)  Height: 5' 1 (1.549 m)  PainSc: 0-No pain   Body mass index is 34.2 kg/m.  Wt Readings from Last 3 Encounters:  03/26/24 181 lb (82.1 kg)  02/22/24 178 lb (80.7 kg)  03/14/23 177 lb (80.3 kg)     Objective:  Physical Exam Constitutional:      Appearance: Normal appearance.  HENT:     Head: Normocephalic.  Cardiovascular:     Rate and Rhythm: Normal rate and regular rhythm.     Pulses: Normal pulses.     Heart sounds: Normal heart sounds.  Pulmonary:     Effort: Pulmonary effort is normal.     Breath sounds: Normal breath sounds.  Abdominal:     General: Bowel sounds are normal.  Musculoskeletal:        General: Normal range of motion.  Skin:    General: Skin is warm and dry.  Neurological:     General: No focal deficit present.     Mental Status: She is alert and oriented to person, place, and time. Mental status is at baseline.  Psychiatric:        Mood and Affect: Mood normal.        Behavior: Behavior normal.         Assessment And Plan:     Encounter for general adult medical examination w/o abnormal findings -     CBC -     CMP14+EGFR  Need for influenza vaccination -     Flu vaccine trivalent PF, 6mos and older(Flulaval,Afluria,Fluarix,Fluzone)  Family history of diabetes mellitus in mother -     Hemoglobin A1c  Primary insomnia -     traZODone HCl; Take 1 tablet (50 mg total) by mouth at bedtime as needed for sleep.  Dispense: 30 tablet; Refill: 1  Encounter for screening for cardiovascular disorders -     Lipid panel  Class 1 obesity due to excess calories with serious comorbidity and body mass index (BMI) of 34.0 to 34.9 in  adult Assessment & Plan: She is encouraged to strive for BMI less than 30 to decrease cardiac risk. Advised to aim for at least 150 minutes of exercise per week.       Return for 1 year physical. Patient was given opportunity to ask questions. Patient verbalized understanding of the plan and was able to repeat key elements of the plan. All questions were answered to their satisfaction.   I, Bruna Creighton, NP, have reviewed all documentation for this visit. The documentation on 04/05/2024 for the exam, diagnosis, procedures, and orders are all accurate and complete.

## 2024-04-05 ENCOUNTER — Ambulatory Visit: Payer: Self-pay | Admitting: Family Medicine

## 2024-04-05 NOTE — Assessment & Plan Note (Signed)
 She is encouraged to strive for BMI less than 30 to decrease cardiac risk. Advised to aim for at least 150 minutes of exercise per week.

## 2024-04-05 NOTE — Progress Notes (Signed)
 LDL 121, goal is less than 100. Low fat diet and exercise is advised. A1c is 5.5, no diabetes . Electrolytes are normal.   Thank you!

## 2024-04-17 ENCOUNTER — Ambulatory Visit: Admitting: Dermatology

## 2024-04-17 ENCOUNTER — Encounter: Payer: Self-pay | Admitting: Dermatology

## 2024-04-17 VITALS — BP 98/64 | HR 80

## 2024-04-17 DIAGNOSIS — B079 Viral wart, unspecified: Secondary | ICD-10-CM | POA: Diagnosis not present

## 2024-04-17 DIAGNOSIS — Z7189 Other specified counseling: Secondary | ICD-10-CM

## 2024-04-17 NOTE — Patient Instructions (Signed)

## 2024-04-17 NOTE — Progress Notes (Signed)
   Follow-Up Visit  Patient (and/or pt guardian) consented to the use of AI-assisted tools for note generation.    Subjective  Christy Graham is a 43 y.o. female who presents for the following: Warts  Patient was last evaluated on 12/12/23.  At this visit patient was advised to continue Wart pen and treated with Cantharone Recommended to start OTC cimetidine daily Patient reports sxs are better. Patient reports she can no longer see them  Patient denies medication changes.  The following portions of the chart were reviewed this encounter and updated as appropriate: medications, allergies, medical history  Review of Systems:  No other skin or systemic complaints except as noted in HPI or Assessment and Plan.  Objective  Well appearing patient in no apparent distress; mood and affect are within normal limits.  A focused examination was performed of the following areas: Right hand   Relevant exam findings are noted in the Assessment and Plan.   Right 4th Finger Metacarpophalangeal Joint, Right Proximal 4th Finger Verrucous papules   Assessment & Plan  WART Exam: verrucous papule(s) on right hand  Counseling Discussed viral / HPV (Human Papilloma Virus) etiology and risk of spread /infectivity to other areas of body as well as to other people.  Treatment Plan: Cryotherapy on wart locations 2 cycles Recommended to continue Cimetidine for one more month    VIRAL WARTS, UNSPECIFIED TYPE (2) Right 4th Finger Metacarpophalangeal Joint, Right Proximal 4th Finger Destruction of lesion - Right 4th Finger Metacarpophalangeal Joint, Right Proximal 4th Finger Complexity: simple   Destruction method: cryotherapy   Informed consent: discussed and consent obtained   Timeout:  patient name, date of birth, surgical site, and procedure verified Lesion destroyed using liquid nitrogen: Yes   Region frozen until ice ball extended beyond lesion: Yes   Outcome: patient tolerated  procedure well with no complications   Post-procedure details: wound care instructions given     Return if symptoms worsen or fail to improve.  I, Lyle Cords, as acting as a neurosurgeon for [employer], DO .   Documentation: I have reviewed the above documentation for accuracy and completeness, and I agree with the above.  Delon Lenis, DO

## 2024-05-13 ENCOUNTER — Other Ambulatory Visit: Payer: Self-pay | Admitting: Nurse Practitioner

## 2024-05-13 ENCOUNTER — Ambulatory Visit
Admission: RE | Admit: 2024-05-13 | Discharge: 2024-05-13 | Disposition: A | Source: Ambulatory Visit | Attending: Nurse Practitioner | Admitting: Nurse Practitioner

## 2024-05-13 DIAGNOSIS — Z1231 Encounter for screening mammogram for malignant neoplasm of breast: Secondary | ICD-10-CM

## 2024-06-23 ENCOUNTER — Ambulatory Visit: Admitting: Dermatology

## 2025-02-24 ENCOUNTER — Ambulatory Visit: Admitting: Radiology

## 2025-03-30 ENCOUNTER — Encounter: Payer: Self-pay | Admitting: Nurse Practitioner
